# Patient Record
Sex: Female | Born: 1980 | Race: White | Hispanic: No | Marital: Married | Smoking: Never smoker
Health system: Southern US, Community
[De-identification: ages and names within clinical notes are randomized; demographics above are authoritative.]

## PROBLEM LIST (undated history)

## (undated) DIAGNOSIS — K219 Gastro-esophageal reflux disease without esophagitis: Secondary | ICD-10-CM

## (undated) DIAGNOSIS — E559 Vitamin D deficiency, unspecified: Secondary | ICD-10-CM

## (undated) DIAGNOSIS — R1310 Dysphagia, unspecified: Secondary | ICD-10-CM

## (undated) DIAGNOSIS — E538 Deficiency of other specified B group vitamins: Secondary | ICD-10-CM

## (undated) DIAGNOSIS — D649 Anemia, unspecified: Secondary | ICD-10-CM

## (undated) DIAGNOSIS — F32A Depression, unspecified: Secondary | ICD-10-CM

## (undated) DIAGNOSIS — E039 Hypothyroidism, unspecified: Secondary | ICD-10-CM

## (undated) DIAGNOSIS — M2550 Pain in unspecified joint: Secondary | ICD-10-CM

## (undated) DIAGNOSIS — F419 Anxiety disorder, unspecified: Secondary | ICD-10-CM

## (undated) DIAGNOSIS — R002 Palpitations: Secondary | ICD-10-CM

## (undated) DIAGNOSIS — I10 Essential (primary) hypertension: Secondary | ICD-10-CM

## (undated) HISTORY — DX: Dysphagia, unspecified: R13.10

## (undated) HISTORY — DX: Palpitations: R00.2

## (undated) HISTORY — PX: TONSILLECTOMY: SUR1361

## (undated) HISTORY — DX: Anxiety disorder, unspecified: F41.9

## (undated) HISTORY — DX: Depression, unspecified: F32.A

## (undated) HISTORY — PX: ANTERIOR CRUCIATE LIGAMENT REPAIR: SHX115

## (undated) HISTORY — DX: Vitamin D deficiency, unspecified: E55.9

## (undated) HISTORY — DX: Deficiency of other specified B group vitamins: E53.8

## (undated) HISTORY — DX: Anemia, unspecified: D64.9

## (undated) HISTORY — DX: Gastro-esophageal reflux disease without esophagitis: K21.9

## (undated) HISTORY — PX: NASAL SINUS SURGERY: SHX719

## (undated) HISTORY — DX: Pain in unspecified joint: M25.50

---

## 2001-12-17 ENCOUNTER — Encounter (INDEPENDENT_AMBULATORY_CARE_PROVIDER_SITE_OTHER): Payer: Self-pay

## 2001-12-17 ENCOUNTER — Ambulatory Visit (HOSPITAL_BASED_OUTPATIENT_CLINIC_OR_DEPARTMENT_OTHER): Admission: RE | Admit: 2001-12-17 | Discharge: 2001-12-17 | Payer: Self-pay | Admitting: Urology

## 2001-12-20 ENCOUNTER — Encounter: Payer: Self-pay | Admitting: Internal Medicine

## 2001-12-20 ENCOUNTER — Encounter: Admission: RE | Admit: 2001-12-20 | Discharge: 2001-12-20 | Payer: Self-pay | Admitting: Internal Medicine

## 2002-01-22 ENCOUNTER — Emergency Department (HOSPITAL_COMMUNITY): Admission: EM | Admit: 2002-01-22 | Discharge: 2002-01-22 | Payer: Self-pay | Admitting: Emergency Medicine

## 2002-01-25 ENCOUNTER — Inpatient Hospital Stay (HOSPITAL_COMMUNITY): Admission: RE | Admit: 2002-01-25 | Discharge: 2002-01-27 | Payer: Self-pay | Admitting: Internal Medicine

## 2002-01-25 ENCOUNTER — Encounter (INDEPENDENT_AMBULATORY_CARE_PROVIDER_SITE_OTHER): Payer: Self-pay | Admitting: Specialist

## 2002-01-26 ENCOUNTER — Encounter: Payer: Self-pay | Admitting: Internal Medicine

## 2002-01-31 HISTORY — PX: TUBAL LIGATION: SHX77

## 2002-02-08 ENCOUNTER — Other Ambulatory Visit: Admission: RE | Admit: 2002-02-08 | Discharge: 2002-02-08 | Payer: Self-pay | Admitting: Obstetrics and Gynecology

## 2002-04-09 ENCOUNTER — Encounter: Admission: RE | Admit: 2002-04-09 | Discharge: 2002-04-09 | Payer: Self-pay | Admitting: *Deleted

## 2002-04-09 ENCOUNTER — Encounter: Payer: Self-pay | Admitting: *Deleted

## 2002-05-16 ENCOUNTER — Emergency Department (HOSPITAL_COMMUNITY): Admission: EM | Admit: 2002-05-16 | Discharge: 2002-05-17 | Payer: Self-pay | Admitting: Emergency Medicine

## 2002-05-27 ENCOUNTER — Encounter: Payer: Self-pay | Admitting: Obstetrics and Gynecology

## 2002-05-27 ENCOUNTER — Encounter: Admission: RE | Admit: 2002-05-27 | Discharge: 2002-05-27 | Payer: Self-pay | Admitting: Obstetrics and Gynecology

## 2002-06-26 ENCOUNTER — Inpatient Hospital Stay (HOSPITAL_COMMUNITY): Admission: AD | Admit: 2002-06-26 | Discharge: 2002-06-26 | Payer: Self-pay | Admitting: Obstetrics and Gynecology

## 2002-06-27 ENCOUNTER — Inpatient Hospital Stay (HOSPITAL_COMMUNITY): Admission: AD | Admit: 2002-06-27 | Discharge: 2002-06-27 | Payer: Self-pay | Admitting: Obstetrics & Gynecology

## 2002-06-27 ENCOUNTER — Observation Stay (HOSPITAL_COMMUNITY): Admission: AD | Admit: 2002-06-27 | Discharge: 2002-06-28 | Payer: Self-pay | Admitting: Obstetrics & Gynecology

## 2002-07-03 ENCOUNTER — Inpatient Hospital Stay (HOSPITAL_COMMUNITY): Admission: AD | Admit: 2002-07-03 | Discharge: 2002-07-03 | Payer: Self-pay | Admitting: Obstetrics and Gynecology

## 2002-07-31 ENCOUNTER — Ambulatory Visit (HOSPITAL_COMMUNITY): Admission: RE | Admit: 2002-07-31 | Discharge: 2002-07-31 | Payer: Self-pay | Admitting: Obstetrics and Gynecology

## 2002-08-01 ENCOUNTER — Inpatient Hospital Stay (HOSPITAL_COMMUNITY): Admission: AD | Admit: 2002-08-01 | Discharge: 2002-08-04 | Payer: Self-pay | Admitting: Obstetrics and Gynecology

## 2002-08-01 ENCOUNTER — Encounter (INDEPENDENT_AMBULATORY_CARE_PROVIDER_SITE_OTHER): Payer: Self-pay

## 2002-08-11 ENCOUNTER — Inpatient Hospital Stay (HOSPITAL_COMMUNITY): Admission: AD | Admit: 2002-08-11 | Discharge: 2002-08-11 | Payer: Self-pay | Admitting: Obstetrics & Gynecology

## 2002-08-30 ENCOUNTER — Other Ambulatory Visit: Admission: RE | Admit: 2002-08-30 | Discharge: 2002-08-30 | Payer: Self-pay | Admitting: Obstetrics and Gynecology

## 2002-11-21 ENCOUNTER — Ambulatory Visit (HOSPITAL_COMMUNITY): Admission: RE | Admit: 2002-11-21 | Discharge: 2002-11-21 | Payer: Self-pay | Admitting: Obstetrics and Gynecology

## 2004-03-23 ENCOUNTER — Encounter: Admission: RE | Admit: 2004-03-23 | Discharge: 2004-03-23 | Payer: Self-pay | Admitting: Family Medicine

## 2005-06-29 ENCOUNTER — Encounter: Admission: RE | Admit: 2005-06-29 | Discharge: 2005-06-29 | Payer: Self-pay | Admitting: Family Medicine

## 2005-12-21 ENCOUNTER — Encounter: Admission: RE | Admit: 2005-12-21 | Discharge: 2005-12-21 | Payer: Self-pay | Admitting: Orthopedic Surgery

## 2006-12-22 ENCOUNTER — Ambulatory Visit (HOSPITAL_COMMUNITY): Admission: RE | Admit: 2006-12-22 | Discharge: 2006-12-22 | Payer: Self-pay | Admitting: Orthopedic Surgery

## 2006-12-22 ENCOUNTER — Encounter (INDEPENDENT_AMBULATORY_CARE_PROVIDER_SITE_OTHER): Payer: Self-pay | Admitting: Orthopedic Surgery

## 2006-12-22 ENCOUNTER — Ambulatory Visit: Payer: Self-pay | Admitting: Vascular Surgery

## 2007-08-15 ENCOUNTER — Encounter: Admission: RE | Admit: 2007-08-15 | Discharge: 2007-08-15 | Payer: Self-pay | Admitting: Family Medicine

## 2008-11-24 ENCOUNTER — Encounter: Admission: RE | Admit: 2008-11-24 | Discharge: 2008-11-24 | Payer: Self-pay | Admitting: Orthopedic Surgery

## 2010-02-21 ENCOUNTER — Encounter: Payer: Self-pay | Admitting: Family Medicine

## 2010-06-18 NOTE — H&P (Signed)
   Casey Berger, Casey Berger                      ACCOUNT NO.:  [id number]   MEDICAL RECORD NO.:  [id number]                   PATIENT TYPE:  OBV   LOCATION:  9151                                 FACILITY:  [facility]   PHYSICIAN:  Lenoard Aden, M.D.             DATE OF BIRTH:  1980/08/18   DATE OF ADMISSION:  06/27/2002  DATE OF DISCHARGE:                                HISTORY & PHYSICAL   CHIEF COMPLAINT:  Elevated blood pressure and spotting.   HISTORY OF PRESENT ILLNESS:  This patient is a 30 year old white female, G2,  P69, EDD of August 28, 2002, at 31+ weeks gestation who presents spotting one  day ago, and now with elevated blood pressures in the office status post  betamethasone on May 26 and May 27.   MEDICATIONS:  The patient's medications include Zoloft, Prevacid and  betamethasone as noted.   ALLERGIES:  The patient has no known drug allergies.   PAST HISTORY:  1. History of postpartum depression.  2. History of sinus surgery.  3. History of tonsillectomy.  4. Cystoscopy.  5. Sigmoidoscopy.  6. History of questionable colitis-related symptoms.   PRENATAL LABORATORY DATA:  Blood type O positive, Rh antibody negative.  Rubella immune.  Hepatitis B surface antigen and HIV were declined.   PHYSICAL EXAMINATION:  GENERAL:  On physical exam she is a well-developed,  well-nourished white female in no acute distress.  VITAL SIGNS:  Blood pressure 142/98, 150/96 and 150/90.  HEENT:  Normal.  LUNGS:  Clear.  HEART:  Regular rhythm.  ABDOMEN:  Soft, gravid and nontender.  PELVIC EXAMINATION:  Deferred.  EXTREMITIES:  No cords.  NEUROLOGIC EXAMINATION:  Nonfocal.   The NST is reactive.  __________  regular, usual activity noted.   IMPRESSION:  1. Thirty-one-week intrauterine pregnancy.  2. Known complete previa with a history of spotting.  3. History of herpes simplex virus.  4. History of vaginal delivery with perineal dehiscence.  5. Elevation of blood pressure.   PLAN:  1. Admit for 23-hour observation.  2. Possible admission.  3. Bleeding precautions; hold clots.  4. Check PIH labs, serial blood pressure monitoring.  5. Continuous fetal monitoring.  6. Follow up within 24 hours.                                                  Lenoard Aden, M.D.    RJT/MEDQ  D:  06/27/2002  T:  06/28/2002  Job:  [id number]

## 2010-06-18 NOTE — Op Note (Signed)
Casey Berger, Casey Berger                      ACCOUNT NO.:  [id number]   MEDICAL RECORD NO.:  [id number]                   PATIENT TYPE:  AMB   LOCATION:  SDC                                  [facility]ACILITY:  [facility]   PHYSICIAN:  Lenoard Aden, M.D.             DATE OF BIRTH:  April 01, 1980   DATE OF PROCEDURE:  11/21/2002  DATE OF DISCHARGE:                                 OPERATIVE REPORT   PREOPERATIVE DIAGNOSIS:  Elective sterilization.   POSTOPERATIVE DIAGNOSES:  1. Elective sterilization.  2. Abdominal wall-omental adhesions.   PROCEDURE:  Laparoscopic tubal sterilization and lysis of adhesions.   SURGEON:  Lenoard Aden, M.D.   ESTIMATED BLOOD LOSS:  Less than 50 mL.   ANESTHESIA:  General.   There were no complications.  The patient to recovery in good condition.   FINDINGS:  Normal tube, normal ovaries, normal anterior and posterior cul-de-  sac.  There are omental adhesions to the anterior abdominal wall.   DESCRIPTION OF PROCEDURE:  After being apprised of the risks of anesthesia,  infection, bleeding, injury to abdominal organs and need for repair, failure  risk of tubal ligation of five to 10 per thousand, the patient was brought  to the operating room, where she was administered a general anesthetic  without complications, prepped and draped in the usual sterile fashion,  catheterized until the bladder is empty.  Exam under anesthesia revealed a  midposition, anteflexed uterus, normal size, shape, and contour, no adnexal  masses.  A Hulka tenaculum placed per vagina, infraumbilical incision made  with a scalpel after placement of dilute Marcaine, Veress needle placed,  opening pressure of -1 noted, 3.5 L CO2 insufflated without difficulty.  Atraumatic placement of a Taut trocar is done without difficulty.  Visualization reveals atraumatic intra-abdominal, intraperitoneal entry.  Pictures taken, normal liver, gallbladder bed.  Anterior abdominal wall  adhesions  of the omentum are cauterized using Kleppinger bipolar cautery and  cut using hook scissors.  Good hemostasis is noted.  Attention is turned to  the uterus and tubes, whereby the right tube is traced out to the fimbriated  end, coagulated down to a resistance of 0 using the Kleppinger bipolar  cautery in three contiguous areas, and divided using hook scissors.  Same  procedure done on the left.  Normal ovaries and tubes are noted as  previously noted.  At this time CO2 is released, good hemostasis is noted.  All instruments removed from the abdomen.  A 0 Vicryl placed in the  infraumbilical incision.  Skin closed with Dermabond.  The patient tolerates  the procedure well, is awakened and transferred to recovery in good  condition.  Lenoard Aden, M.D.   RJT/MEDQ  D:  11/21/2002  T:  11/21/2002  Job:  [id number]

## 2010-06-18 NOTE — Discharge Summary (Signed)
Casey Berger, STONG NO.:  [id number]   MEDICAL RECORD NO.:  [id number]                   PATIENT TYPE:  INP   LOCATION:  0451                                 FACILITY:  [facility]   PHYSICIAN:  Barbette Hair. Arlyce Dice, M.D. [facility]          DATE OF BIRTH:  Jun 15, 1980   DATE OF ADMISSION:  01/25/2002  DATE OF DISCHARGE:  01/27/2002                                 DISCHARGE SUMMARY   ADMISSION DIAGNOSES:  58. A 30 year old white female with acute colitis, rule out ischemic versus     infectious.  2. Threatened nutritional status in the setting of early intrauterine     pregnancy.  3. A nine week pregnancy by size and dates.  4. New diagnosis of interstitial cystitis with urethral dilation, November     2003.  5. Status post tonsillectomy and adenoidectomy.  6. Prior sinus surgery.  7. Depression.   DISCHARGE DIAGNOSES:  1. Resolving acute colitis, suspected segmental ischemic colitis,     symptomatically markedly improved with biopsies pending at the time of     discharge.  2. Threatened nutritional status in the setting of early intrauterine     pregnancy.  3. A nine week pregnancy by size and dates.  4. New diagnosis of interstitial cystitis with urethral dilation, November     2003.  5. Status post tonsillectomy and adenoidectomy.  6. Prior sinus surgery.  7. Depression.   CONSULTATIONS:  None.   PROCEDURE:  Flexible sigmoidoscopy on the day of admission per Lina Sar,  M.D. with biopsies.   BRIEF HISTORY:  Ayshia is a pleasant 30 year old white female, generally  in good health with history as described above.  She has not had any prior  GI history.  She had onset on Monday, January 21, 2002, with fairly acute,  severe, crampy, lower abdominal pain followed by loose stools which was then  followed by dark red blood.  She said she was nauseated and vomited once on  January 21, 2002.  She has not had any documented fever or chills but has  been  having 2-3 bowel movements per day associated with red blood.  Initially, she was having rather constant lower abdominal pain which is now  more intermittent and cramping.  She had been unable to eat over the past 5-  6 days and had been primarily on liquids.  She had tried to eat some food  prior to the day of admission with abrupt increase in her abdominal pain and  diarrhea.  She was initially seen in the emergency room at [facility]  a few days ago and then sent to Lenoard Aden, M.D. in [place] for  OB/GYN evaluation, and she is nine weeks pregnant.  A pelvic ultrasound has  been done which shows she has a normal viable nine week pregnancy  apparently.  GI was then called, and the patient was sent to the emergency  room at  [facility] for further evaluation.  She was seen by Wilhemina Bonito. Marina Goodell,  M.D., who was covering on-call on January 23, 2002.  At that time, CBC was  normal with normal white count and a hemoglobin of 13.4.  UA was negative.  It was felt that she likely had an infectious colitis and was discharged  home with Cipro and Darvocet.  She was scheduled to see Lina Sar, M.D. in  the office in 48 hours.  Apparently since that time, she had been unable to  take the Cipro due to increased nausea, continued to have low-grade temp.  and watery stool, has not seen any blood in the past 24 hours.  She is still  not eating, and there was concern about her nutritional status.   The patient underwent flexible sigmoidoscopy with Lina Sar, M.D. on the  day of admission with finding of an acute colitis from 40-60 cm.  It was  felt this was consistent with an ischemic picture.  Biopsies were taken, and  she was admitted to the hospital for IV fluids, bowel rest, and Unasyn and  for possible TNA.   LABORATORY DATA, January 25, 2002:  WBC 5.1, hemoglobin 11.7, hematocrit  32.6, MCV 82.5, platelets 225, sed rate 42.  Electrolytes within normal  limits.  BUN 4, creatinine  0.7, albumin 3.  LFTs normal.  Follow-up on  January 27, 2002, shows WBC of 3.9, hemoglobin 11.7, hematocrit 33.7.  Sed  rate was 42.  TSH 1.476.  Prealbumin 14.3 on admission.  UA was negative.  Stool for C. difficile was negative.  Stool for O&P negative and stool  cultures were negative.   X-RAY STUDIES:  A PICC line was placed on January 26, 2002, in anticipation  of possible TNA.   HOSPITAL COURSE:  The patient was admitted to the service of Lina Sar,  M.D. with plans as outlined above.  She was started on IV fluids, covered  with IV Unasyn.  Stool cultures were obtained, and flexible sigmoidoscopy  was done just prior to admission.  Dr. Juanda Chance also scheduled PICC line  placement with anticipation of TNA.  By January 26, 2002, the patient had  significant decrease in diarrhea and abdominal discomfort and was feeling  better.  She was continued on a clear liquid diet, and TNA was held off.  By  January 27, 2002, she was feeling much improved, anxious to go home, and  had had no further pain or cramping, said her lower abdominal pain had  pretty much resolved, and she had not had any bowel movements in 24 hours.  She had been afebrile since admission.  Vital signs were stable.  WBC was  3.9, hemoglobin 11.6.  Stool cultures were negative, and the colon biopsies  were still pending.  She was felt to be stable and allowed discharge to home  after we had made sure she could tolerate a solid diet which she did.  We  discontinued the Unasyn.   MEDICATIONS ON DISCHARGE:  Prenatal vitamin daily and Zoloft 50 mg daily.   DIET:  Soft, bland.   FOLLOW UP:  She was to follow up with Dr. Juanda Chance in the office in two weeks  or sooner if necessary.   ADDENDUM:  Colon biopsies showed fragments of colonic mucosa with fibrosis,  mucus depleted glands and hemorrhage.  Appearance consistent with an ischemic process; however, given the patient's young age, resolving severe  acute colitis also  in the differential.  No features  of cytomegalovirus or  granuloma identified.     Mike Gip, P.A.-C. [facility]                Robert D. Arlyce Dice, M.D. [facility]    AE/MEDQ  D:  02/07/2002  T:  02/07/2002  Job:  [id number]   cc:   Lina Sar, M.D. [facility]   Lenoard Aden, M.D.  [address], [address]  [place]  [place] [address]  Fax: [phone]

## 2010-06-18 NOTE — H&P (Signed)
Casey Berger, Casey Berger                      ACCOUNT NO.:  [id number]   MEDICAL RECORD NO.:  [id number]                   PATIENT TYPE:  INP   LOCATION:  NA                                   [facility]ACILITY[facility]   PHYSICIAN:  Lenoard Aden, M.D.             DATE OF BIRTH:  1980/05/17   DATE OF ADMISSION:  DATE OF DISCHARGE:                                HISTORY & PHYSICAL   CHIEF COMPLAINT:  Complete placenta previa.   HISTORY OF PRESENT ILLNESS:  The patient is a 30 year old white female G2  P1, EDD of August 28, 2002 at 36 weeks with positive lung maturity per  amniocentesis and complete placenta previa who presents for primary C-  section.   PAST MEDICAL HISTORY:  1. Remarkable for questionable diagnosis of recent ischemic colitis.  2. History of cystoscopy for interstitial cystitis.  3. History of postpartum depression with previous pregnancy; currently on     Zoloft.  4. History of left ankle fracture.  5. 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  6. Sinus surgery, T&A, and sigmoidoscopy.  7. Fourth degree laceration noted with previous vaginal delivery and this     was a spontaneous vaginal birth.   MEDICATIONS:  Prenatal vitamins and Zoloft.   ALLERGIES:  No known drug allergies.   The pregnancy is complicated by placenta previa, now the ischemic colitis,  and intermittent depressive symptoms.  The patient has had no vaginal  bleeding.   PHYSICAL EXAMINATION:  GENERAL:  She is a well-developed, well-nourished  white female in no acute distress.  HEENT:  Normal.  LUNGS:  Clear.  HEART:  Regular rhythm.  ABDOMEN:  Soft, gravid, and nontender.  PELVIC:  Cervical exam is deferred.  EXTREMITIES:  Show no cords.  NEUROLOGIC:  Nonfocal.   IMPRESSION:  Complete placenta previa at 36 weeks status post positive  mature indices on amniocentesis.   PLAN:  Proceed with a primary C-section.  Risks of anesthesia, infection,  bleeding, injury to abdominal  organs with need for repair is discussed.  The  patient has 2 units of donated blood pending and ready for use.  Transfusion  risks discussed.  Risks of infection, HIV, and hepatitis previously  discussed with the patient.  Under these circumstances,  transfusions need to be pursued in order to preserve her well-being.  The  possible need for prolonged invasive surgery to include the possible need  for hysterectomy discussed with the patient in detail today.  The patient  and husband in attendance.  They acknowledge to proceed.  Will continue her  Zoloft postpartum.                                               Lenoard Aden, M.D.    RJT/MEDQ  D:  08/01/2002  T:  08/01/2002  Job:  [id number]

## 2010-06-18 NOTE — H&P (Signed)
NAMEKARISTA, AISPURO NO.:  [id number]   MEDICAL RECORD NO.:  [id number]                   PATIENT TYPE:  INP   LOCATION:  0451                                 FACILITY[facility]   PHYSICIAN:  Lina Sar, M.D. [facility]               DATE OF BIRTH:  October 03, 1980   DATE OF ADMISSION:  01/25/2002  DATE OF DISCHARGE:                                HISTORY & PHYSICAL   PROBLEM:  Nine weeks' pregnant with acute illness with left lower quadrant  abdominal pain and rectal bleeding.   HISTORY OF PRESENT ILLNESS:  The patient is a pleasant 30 year old white  female in general good health.  She has had a recent diagnosis of  interstitial cystitis and developed, apparently, a urethral stricture after  her first pregnancy.  The patient has not had any prior GI illnesses or  problems.  At this time she said she had acute onset on Monday, January 21, 2002, with fairly severe crampy lower abdominal pain followed by loose stool  which was then followed by dark red blood.  She said she was nauseated with  this and vomited once on Monday.  She has not had any documented fever or  chills but ever since then has been having two to three bowel movements per  day associated with red blood.  Initially she had constant lower abdominal  pain which is now more intermittent and crampy in nature.  She has not been  eating over the past five to six days, primarily on liquids.  She says that  she tried to eat some solid food yesterday on Christmas Day and had abrupt  increase in her lower abdominal pain and then diarrhea.   The patient was initially seen in the emergency room at [facility]  four days ago and then sent to Dr. Billy Coast for OB/GYN evaluation as she is  nine weeks' pregnant.  She had a pelvic ultrasound which was apparently  normal, showing a viable nine-week pregnancy.  She has not had any vaginal  bleeding.  She was then sent to the emergency room at [facility] on  January 23, 2002, and was evaluated by Dr. Marina Goodell for GI.  WBC at that time  was 10.5, hemoglobin was 13.4, hematocrit was 38.7.  UA was negative.  She  was felt to have an infectious colitis and was discharged home with Cipro  and Darvocet.  She was scheduled to see Dr. Juanda Chance in follow-up in the  office today.  She says she was unable to take the Cipro because it made her  more nauseated and only took two tablets.  She had a low-grade temperature  yesterday at home and has continued with watery stool one time yesterday and  one time today.  She has not seen any blood since yesterday morning.  She is  not eating and is very concerned about this due to early pregnancy.  Dr. Juanda Chance has since done flexible sigmoidoscopy today with finding of an  acute colitis from 40 cm up to 60 cm.  Above that was not visualized.  It  was felt that this was most consistent with an ischemic picture, and  biopsies were taken.  At this time the patient is admitted to the hospital  for IV fluids, bowel rest, and will be covered with IV Unasyn awaiting  cultures and biopsies.   CURRENT MEDICATIONS:  1. Cipro was prescribed, two doses taken.  2. Darvocet-N 100 prescribed January 23, 2002, two to three doses taken.  3. Tylenol p.r.n.  4. Zoloft 50 mg h.s.  5. Multivitamin daily.   ALLERGIES:  No known drug allergies.   PAST MEDICAL HISTORY:  1. Urethral dilation in November 2003, with urethral stricture after     episiotomy in January 2003, with birth.  2. Probable interstitial cystitis by patient's report.  3. Depression.  4. Prior sinus surgery.  5. Normal vaginal delivery January 2003.  6. Status post T&A.   FAMILY HISTORY:  Negative for GI disease.  Mother with GERD.  Otherwise  unremarkable.   SOCIAL HISTORY:  The patient is married.  She is currently a homemaker.  Has  an 62-month-old son at home.  Currently nine weeks pregnant.  No tobacco,  and no ETOH.   REVIEW OF SYSTEMS:   CARDIOVASCULAR:  Negative.  PULMONARY:  Negative.  GENITOURINARY:  Negative currently.  GASTROINTESTINAL:  As outlined above.   PHYSICAL EXAMINATION:  GENERAL:  Well-developed white female in no acute  distress.  She is anxious.  VITAL SIGNS:  Blood pressure 150/94, pulse 80, temperature 99.  Weight 230.  HEENT:  Normocephalic, atraumatic.  EOMI.  PERRLA.  Sclerae anicteric.  NECK:  Supple.  Without nodes.  CARDIOVASCULAR:  Regular rate and rhythm.  With S1 and S2.  No murmur, rub,  or gallop.  LUNGS:  Clear to A&P.  ABDOMEN:  Soft, obese.  Bowel sounds are present but hypoactive.  She is  tender in the left lower quadrant.  No guarding or rebound.  No mass or  hepatosplenomegaly.  Fetal heart sounds not heard with regular stethoscope.  RECTAL:  Per Dr. Juanda Chance, yellow heme-positive stool.  EXTREMITIES:  Without clubbing, cyanosis, or edema.  NEUROLOGIC:  Grossly nonfocal.   IMPRESSION:  1. The patient is a 30 year old female with acute colitis, likely ischemic.  2. Threatened nutritional status in the setting of early intrauterine     pregnancy.  3. Intrauterine pregnancy, nine weeks by size and dates.  4. New diagnosis of interstitial cystitis with urethral dilation November     2003.  5. Status post tonsillectomy and adenoidectomy.  6. Prior sinus surgery.  7. Depression.   PLAN:  The patient is admitted to the service of Dr. Lina Sar for IV  fluid hydration.  Will check stool for C&S, O&P and C. difficile.  Await  biopsies.  PICC line to be placed for TNA.  She will be covered empirically  with IV Unasyn in the short-term.  For details, please see the orders.    ADDENDUM:  The patient did relate that her husband had been acutely ill for  48 hours.  His illness started the day prior to hers.  He had nausea,  vomiting, and diarrhea which rapidly abated.     Mike Gip, P.A.-C. [facility]                Lina Sar, M.D. [facility]   AE/MEDQ  D:  01/25/2002  T:  01/25/2002  Job:   [id number]   cc:   Lenoard Aden, M.D.  [address], [address]  [place]  [place] [address]  Fax: [phone]

## 2010-06-18 NOTE — Op Note (Signed)
NAMEJESSICAANN, OVERBAUGH                        ACCOUNT NO.:  [id number]   MEDICAL RECORD NO.:  [id number]                   PATIENT TYPE:  AMB   LOCATION:  NESC                                 FACILITY:  [facility]   PHYSICIAN:  Maretta Bees. Vonita Moss, M.D.             DATE OF BIRTH:  1980-08-19   DATE OF PROCEDURE:  12/17/2001  DATE OF DISCHARGE:                                 OPERATIVE REPORT   PREOPERATIVE DIAGNOSIS:  Rule out urethral stricture as the cause of  frequency and urgency of urination.   POSTOPERATIVE DIAGNOSIS:  Rule out interstitial cystitis.   PROCEDURE:  Cystoscopy, urethral dilation, hydraulic dilation of bladder and  cold cup bladder biopsy.   SURGEON:  Maretta Bees. Vonita Moss, M.D.   ANESTHESIA:  General.   INDICATIONS:  This 30 year old white female delivered her first baby several  months ago and had a vaginal tear and a postoperative infection. Since then  she has had increased urgency, frequency, fullness in the bladder, frequency  x 12 in the day and nocturia x 2 to 3. Antibiotics have not helped her. She  had a negative urine and a small residual urine in early October. She was  put on trimethoprim  for urethritis and also some Urised, but her frequency  and slow dribbly strain and nocturia persisted. She is brought to the  operating room today for further evaluation.   DESCRIPTION OF PROCEDURE:  The patient was brought to the operating room and  placed in the lithotomy position. The external genitalia were prepped and  draped in the usual fashion. The urethral meatus was in the normal position.  There was no evidence of a urethral tear or foreshortening. Female sounds  were passed to [address] [place] with no significant tightness.   She was then  cystoscoped. The bladder had no stones, tumors, inflammatory  lesions. There were no intraurethral lesions. I then filled her to 700 cc  and looked back in because she was uncomfortable with filling, and she had  submucosal  petechiae and hemorrhage on the bladder floor and also some  smaller petechiae in the other three quadrants of the bladder, suspicious  for interstitial cystitis.   Cold cup bladder biopsy was used to perform biopsies of the hemorrhagic  areas on the bladder floor, which were then fulgurated with the Bugbee  electrode. The bladder was emptied and the scope was removed and the patient  was  sent to the  recovery room in good condition after inserting a B&O  suppository.                                                Maretta Bees. Vonita Moss, M.D.    LJP/MEDQ  D:  12/17/2001  T:  12/17/2001  Job:  [id number]   cc:  Francis P. Modesto Charon, M.D.

## 2010-06-18 NOTE — Discharge Summary (Signed)
   NAMEHALA, NARULA                      ACCOUNT NO.:  [id number]   MEDICAL RECORD NO.:  [id number]                   PATIENT TYPE:  INP   LOCATION:  9139                                 [facility]ACILITY[facility]   PHYSICIAN:  Lenoard Aden, M.D.             DATE OF BIRTH:  [birth year]   DATE OF ADMISSION:  08/01/2002  DATE OF DISCHARGE:  08/04/2002                                 DISCHARGE SUMMARY   DISCHARGE SUMMARY:  The patient underwent an uncomplicated primary cesarean  section for placenta previa on 08/01/2002 and discharged home on 08/03/2002.  The postoperative course was uncomplicated.  Tolerated a regular diet well  and discharged to home on Zoloft, Percocet, Motrin, Chromagen.  Discharge  teaching done.  Follow up in the office in 4-6 weeks.                                                Lenoard Aden, M.D.    RJT/MEDQ  D:  08/25/2002  T:  08/25/2002  Job:  [id number]

## 2010-06-18 NOTE — Op Note (Signed)
[name], Casey Berger                      ACCOUNT NO.:  [id number]   MEDICAL RECORD NO.:  [id number]                   PATIENT TYPE:  INP   LOCATION:  9139                                 FACILITY:  [facility]   PHYSICIAN:  Lenoard Aden, M.D.             DATE OF BIRTH:  1980-09-16   DATE OF PROCEDURE:  08/01/2002  DATE OF DISCHARGE:                                 OPERATIVE REPORT   PREOPERATIVE DIAGNOSES:  Complete placenta previa 36 weeks, mature lung  indices by amniocentesis.   POSTOPERATIVE DIAGNOSES:  Complete placenta previa 36 weeks, mature lung  indices by amniocentesis.   PROCEDURE:  Primary low segment transverse cesarean section.   SURGEON:  Lenoard Aden, M.D.   ASSISTANT:  __________   Bronwen Betters BLOOD LOSS:  1000 mL.   COMPLICATIONS:  None.   DRAINS:  Foley catheter.   COUNTS:  Correct.   DISPOSITION:  The patient was taken to recovery room in good condition.   DESCRIPTION OF PROCEDURE:  After being apprised of the risks and benefits of  anesthesia, infection, bleeding, injury to abdominal organs with need for  repair, transfusion risks, anesthesia risks, hysterectomy risks as  previously noted.  The patient is taken to the operating room where she was  administered a spinal anesthetic without complications and prepped and  draped in usual sterile fashion.  Foley catheter placed.  After achieving  adequate anesthesia, dilute Marcaine solution placed.  Pfannenstiel skin  incision made with a scalpel, carried down to the fascia which was nicked in  midline and opened up transversely using Mayo scissors.  Rectus muscles  dissected sharply. Parietal peritoneum opened sharply.  Visceral peritoneum  then scored in a smile-like fashion over the lower uterine segment.  There  are some large engorged venous vessels but no evidence of accreta or  increta. Bladder flap was easily dissected off the lower uterine segment.  Lower transverse incision is made.   Amniotomy of clear fluid  transplacentally done for an atraumatic delivery of a full term living female,  Apgars 7, 7 and 9.  Placenta delivered manually intact from an anterior  previa location and sent to pathology for confirmation.  At this time,  uterus is exteriorized, curetted using a dry lap pack and found to be  hemostatic and no evidence of accreta or increta are noted at this time.  Uterine incision closed in two layers using a 0 Monocryl, interrupted  sutures placed along both lateral angles to control oozing from the lateral  edges.  Bladder flap was inspected and found to be hemostatic.  Uterus is  placed in the abdominal cavity.  Pelvic gutters irrigated. All blood clots  removed.  Good hemostasis is confirmed.  Closure of the fascia was with 0  Monocryl in continuous running fashion.  Skin closed using staples.  The  patient tolerated the procedure well and was transferred to the recovery  room in good condition.  Lenoard Aden, M.D.    RJT/MEDQ  D:  08/01/2002  T:  08/02/2002  Job:  [id number]   cc:   [facility]

## 2010-06-18 NOTE — H&P (Signed)
   Casey Berger, Casey Berger                      ACCOUNT NO.:  [id number]   MEDICAL RECORD NO.:  [id number]                   PATIENT TYPE:  AMB   LOCATION:  SDC                                  FACILITY:  [facility]   PHYSICIAN:  Lenoard Aden, M.D.             DATE OF BIRTH:  [birth year]   DATE OF ADMISSION:  DATE OF DISCHARGE:                                HISTORY & PHYSICAL   CHIEF COMPLAINT:  Elective sterilization.   HISTORY OF PRESENT ILLNESS:  The patient is a 30 year old white female, G2  P2, status post C-section for placenta previa in July 2004 who presents for  elective sterilization.   PAST MEDICAL HISTORY:  Past medical history is remarkable for:  1. History of ischemic colitis.  2. History of cystoscopy for interstitial cystitis.  3. History of  postpartum depression.  4. History of left ankle fracture.  5. 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  6. Sinus surgery.  7. Tonsillectomy.  8. Sigmoidoscopy.  9. History of a fourth degree laceration with previous vaginal birth.   MEDICATIONS:  Prenatal vitamins and Zoloft.   ALLERGIES:  The patient has no known drug allergies.   FAMILY HISTORY:  Family history is noncontributory.   PHYSICAL EXAMINATION:  GENERAL APPEARANCE:  On physical examination she is a  well-developed, well-nourished white female in no acute distress.  HEENT:  Normal.  LUNGS:  Clear.  HEART:  Regular rhythm.  ABDOMEN:  Soft, non-gravid and nontender.  PELVIC EXAMINATION:  No adnexal masses.  EXTREMITIES:  Extremities show no cords.  NEUROLOGIC:  Neurologic exam is nonfocal.   IMPRESSION:  Desires for elective sterilization.   PLAN:  The plan is to proceed with laparoscopic tubal sterilization.   The risks of anesthesia, infection, bleeding and injury to abdominal organs  and need for repair were discussed, delayed versus immediate complications  to include bowel and bladder injury; the patient acknowledges and wishes to  proceed.                                                 Lenoard Aden, M.D.    RJT/MEDQ  D:  11/20/2002  T:  11/21/2002  Job:  [id number]

## 2010-06-18 NOTE — Op Note (Signed)
Casey Berger, METZINGER NO.:  [id number]   MEDICAL RECORD NO.:  [id number]                   PATIENT TYPE:  AMB   LOCATION:  ENDO                                 FACILITY:  [facility]   PHYSICIAN:  Lina Sar, M.D. [facility]               DATE OF BIRTH:  10-Jul-1980   DATE OF PROCEDURE:  01/25/2002  DATE OF DISCHARGE:                                 OPERATIVE REPORT   PROCEDURE:  Flexible sigmoidoscopy.   INDICATIONS FOR PROCEDURE:  This 30 year old white female presented with a  five day history of rectal bleeding, severe crampy abdominal pain, and  leukocytosis.  She was initially in the emergency room 48 hours ago, and  sent home on Cipro, but her condition has not changed except for a decrease  in the rectal bleeding.  She has had low-grade temperature and pain, she has  not been able to eat except for clear liquids.  She is pregnant and in line  by size and date.  She is undergoing flexible sigmoidoscopy because of  suspicion for ischemic colitis.   ENDOSCOPIC ASSESSMENT:  Olympus single chamber scope.   SEDATION:  1. Versed 6 mg IV.  2. Demerol 75 mg IV.   DESCRIPTION OF PROCEDURE:  The Olympus single chamber scope was passed in  the anal sphincter, rectum, to the sigmoid colon.  The patient was monitored  by pulse oximetry.  Oxygen saturations were normal.  The patient did not  receive any colon prep.  The anal canal and __________ was unremarkable.  Colonic mucosa was normal up until 35 to 40 cm from the rectum.  At 40 cm,  the mucosa became ulcerated with large confluent ulcerations covered with  exudate.  Changes were consistent with ischemic colitis.  Colonoscope passed  only up to 60 cm because of extreme discomfort.  Multiple biopsies were  taken from the colonic mucosa.  The patient tolerated the procedure well.   IMPRESSION:  Active colitis of the right colon consistent with ischemic  colitis, status post multiple biopsies.   PLAN:  The  patient will be admitted for bowel rest, intravenous support, and  nutrition supplemented as well.  We will insert a PICC line and start  central TNA to support her pregnancy nutritionally.  She will be on Unasyn  IV.  We will obtain stool cultures, O&P, and C. diff toxin.  Await results  of the biopsies.                                                Lina Sar, M.D. [facility]   DB/MEDQ  D:  01/25/2002  T:  01/25/2002  Job:  [id number]   cc:   Lenoard Aden, M.D.  [address], [address]  [place]  [place] [address]  Fax: [phone]

## 2011-07-12 DIAGNOSIS — F419 Anxiety disorder, unspecified: Secondary | ICD-10-CM | POA: Insufficient documentation

## 2011-07-12 DIAGNOSIS — F32A Depression, unspecified: Secondary | ICD-10-CM | POA: Insufficient documentation

## 2011-10-25 DIAGNOSIS — I10 Essential (primary) hypertension: Secondary | ICD-10-CM | POA: Insufficient documentation

## 2014-05-30 ENCOUNTER — Emergency Department (HOSPITAL_BASED_OUTPATIENT_CLINIC_OR_DEPARTMENT_OTHER)
Admission: EM | Admit: 2014-05-30 | Discharge: 2014-05-30 | Disposition: A | Discharge status: Home / Self Care | Payer: BLUE CROSS/BLUE SHIELD | Attending: Emergency Medicine | Admitting: Emergency Medicine

## 2014-05-30 ENCOUNTER — Encounter (HOSPITAL_BASED_OUTPATIENT_CLINIC_OR_DEPARTMENT_OTHER): Payer: Self-pay | Admitting: Emergency Medicine

## 2014-05-30 ENCOUNTER — Emergency Department (HOSPITAL_BASED_OUTPATIENT_CLINIC_OR_DEPARTMENT_OTHER): Payer: BLUE CROSS/BLUE SHIELD

## 2014-05-30 DIAGNOSIS — Z9889 Other specified postprocedural states: Secondary | ICD-10-CM | POA: Insufficient documentation

## 2014-05-30 DIAGNOSIS — I10 Essential (primary) hypertension: Secondary | ICD-10-CM | POA: Insufficient documentation

## 2014-05-30 DIAGNOSIS — Z79899 Other long term (current) drug therapy: Secondary | ICD-10-CM | POA: Diagnosis not present

## 2014-05-30 DIAGNOSIS — M25562 Pain in left knee: Secondary | ICD-10-CM

## 2014-05-30 HISTORY — DX: Essential (primary) hypertension: I10

## 2014-05-30 LAB — D-DIMER, QUANTITATIVE (NOT AT ARMC)

## 2014-05-30 MED ORDER — KETOROLAC TROMETHAMINE 60 MG/2ML IM SOLN
60.0000 mg | Freq: Once | INTRAMUSCULAR | Status: AC
  Administered 2014-05-30: 60 mg via INTRAMUSCULAR
  Filled 2014-05-30: qty 2

## 2014-05-30 MED ORDER — METHOCARBAMOL 500 MG PO TABS: 500.0000 mg | ORAL_TABLET | Freq: Two times a day (BID) | ORAL | Status: DC

## 2014-05-30 MED ORDER — MELOXICAM 15 MG PO TABS: 15.0000 mg | ORAL_TABLET | Freq: Every day | ORAL | Status: DC

## 2014-05-30 NOTE — ED Provider Notes (Signed)
CSN: [id number]     Arrival date & time 05/30/14  0122 History   First MD Initiated Contact with Patient 05/30/14 [phone]     Chief Complaint  Patient presents with  . Knee Pain     (Consider location/radiation/quality/duration/timing/severity/associated sxs/prior Treatment) Patient is a 34 y.o. female presenting with knee pain. The history is provided by the patient.  Knee Pain Location:  Knee Time since incident:  3 hours Injury: no   Knee location:  L knee Pain details:    Quality:  Burning   Radiates to:  Does not radiate   Severity:  Severe   Onset quality:  Sudden   Timing:  Constant   Progression:  Unchanged Chronicity:  New Dislocation: no   Foreign body present:  No foreign bodies Prior injury to area:  No Relieved by:  Nothing Worsened by:  Nothing tried Ineffective treatments:  None tried Associated symptoms: no back pain, no decreased ROM, no fatigue, no fever, no muscle weakness, no neck pain, no numbness, no swelling and no tingling   Risk factors: no frequent fractures     Past Medical History  Diagnosis Date  . Hypertension    Past Surgical History  Procedure Laterality Date  . Anterior cruciate ligament repair     History reviewed. No pertinent family history. History  Substance Use Topics  . Smoking status: Never Smoker   . Smokeless tobacco: Not on file  . Alcohol Use: No   OB History    No data available     Review of Systems  Constitutional: Negative for fever and fatigue.  Musculoskeletal: Negative for back pain and neck pain.  All other systems reviewed and are negative.     Allergies  Review of patient's allergies indicates no known allergies.  Home Medications   Prior to Admission medications   Medication Sig Start Date End Date Taking? Authorizing Provider  escitalopram (LEXAPRO) 20 MG tablet Take 20 mg by mouth daily.   Yes Historical Provider, MD  lisinopril-hydrochlorothiazide (PRINZIDE,ZESTORETIC) 20-25 MG per tablet Take  1 tablet by mouth daily.   Yes Historical Provider, MD  meloxicam (MOBIC) 15 MG tablet Take 1 tablet (15 mg total) by mouth daily. 05/30/14   Halaina Vanduzer, MD  methocarbamol (ROBAXIN) 500 MG tablet Take 1 tablet (500 mg total) by mouth 2 (two) times daily. 05/30/14   Donella Pascarella, MD   BP 113/62 mmHg  Pulse 63  Temp(Src) 98.6 F (37 C) (Oral)  Resp 18  Ht 5\' 9"  (1.753 m)  Wt 250 lb (113.399 kg)  BMI 36.90 kg/m2  SpO2 100%  LMP 05/09/2014 Physical Exam  Constitutional: She is oriented to person, place, and time. She appears well-developed and well-nourished. No distress.  HENT:  Head: Normocephalic and atraumatic.  Mouth/Throat: Oropharynx is clear and moist.  Eyes: Conjunctivae are normal. Pupils are equal, round, and reactive to light.  Neck: Normal range of motion. Neck supple.  Cardiovascular: Normal rate, regular rhythm and intact distal pulses.   Pulmonary/Chest: Effort normal and breath sounds normal. No respiratory distress. She has no wheezes. She has no rales.  Abdominal: Soft. Bowel sounds are normal. There is no tenderness. There is no rebound and no guarding.  Musculoskeletal: Normal range of motion. She exhibits no edema.       Left hip: Normal.       Left knee: She exhibits normal range of motion, no swelling, no effusion, no ecchymosis, no deformity, no laceration, no erythema, normal alignment, no LCL laxity,  normal patellar mobility, no bony tenderness and normal meniscus. No tenderness found. No medial joint line, no lateral joint line, no MCL, no LCL and no patellar tendon tenderness noted.       Left ankle: Normal. Achilles tendon normal.       Left foot: Normal.  Neurological: She is alert and oriented to person, place, and time. She has normal reflexes.  Skin: Skin is warm and dry.  Psychiatric: She has a normal mood and affect.    ED Course  Procedures (including critical care time) Labs Review Labs Reviewed  D-DIMER, QUANTITATIVE    Imaging Review Dg  Knee Complete 4 Views Left  05/30/2014   CLINICAL DATA:  Severe left knee pain, with burning and swelling. Initial encounter.  EXAM: LEFT KNEE - COMPLETE 4+ VIEW  COMPARISON:  None.  FINDINGS: There is no evidence of fracture or dislocation. The joint spaces are preserved. No significant degenerative change is seen; the patellofemoral joint is grossly unremarkable in appearance. A fabella is noted.  Trace knee joint fluid is within normal limits. The visualized soft tissues are normal in appearance.  IMPRESSION: No evidence of fracture or dislocation.   Electronically Signed   By: Roanna Raider M.D.   On: 05/30/2014 02:26     EKG Interpretation None      MDM   Final diagnoses:  Knee pain, acute, left   Xray and Ddimer are both normal.  No trauma no redness nor warmth nor swelling.   Ice elevation and NSAIDs and prn follow up with orthopedic surgery    Kayceon Oki, MD 05/30/14 1610

## 2014-05-30 NOTE — ED Notes (Signed)
Patient transported to X-ray 

## 2014-05-30 NOTE — ED Notes (Signed)
Pt states that she developed severe pain in her left knee all of the sudden today, reports knee is swollen pmh : dvt to right knee after acl repair

## 2014-05-30 NOTE — Discharge Instructions (Signed)
Cryotherapy Cryotherapy is when you put ice on your injury. Ice helps lessen pain and puffiness (swelling) after an injury. Ice works the best when you start using it in the first 24 to 48 hours after an injury. HOME CARE  Put a dry or damp towel between the ice pack and your skin.  You may press gently on the ice pack.  Leave the ice on for no more than 10 to 20 minutes at a time.  Check your skin after 5 minutes to make sure your skin is okay.  Rest at least 20 minutes between ice pack uses.  Stop using ice when your skin loses feeling (numbness).  Do not use ice on someone who cannot tell you when it hurts. This includes small children and people with memory problems (dementia). GET HELP RIGHT AWAY IF:  You have white spots on your skin.  Your skin turns blue or pale.  Your skin feels waxy or hard.  Your puffiness gets worse. MAKE SURE YOU:   Understand these instructions.  Will watch your condition.  Will get help right away if you are not doing well or get worse. Document Released: 07/06/2007 Document Revised: 04/11/2011 Document Reviewed: 09/09/2010 ExitCare Patient Information 2015 [employer]. This information is not intended to replace advice given to you by your health care provider. Make sure you discuss any questions you have with your health care provider.  

## 2014-07-14 ENCOUNTER — Telehealth: Payer: Self-pay

## 2014-07-14 ENCOUNTER — Encounter (HOSPITAL_COMMUNITY): Payer: Self-pay | Admitting: Emergency Medicine

## 2014-07-14 ENCOUNTER — Ambulatory Visit (HOSPITAL_COMMUNITY)
Admission: EM | Admit: 2014-07-14 | Discharge: 2014-07-14 | Disposition: A | Discharge status: Home / Self Care | Payer: BLUE CROSS/BLUE SHIELD | Attending: Emergency Medicine | Admitting: Emergency Medicine

## 2014-07-14 ENCOUNTER — Emergency Department (HOSPITAL_COMMUNITY): Payer: BLUE CROSS/BLUE SHIELD

## 2014-07-14 ENCOUNTER — Encounter (HOSPITAL_COMMUNITY)
Admission: EM | Disposition: A | Discharge status: Home / Self Care | Payer: Self-pay | Source: Home / Self Care | Attending: Emergency Medicine

## 2014-07-14 DIAGNOSIS — Y999 Unspecified external cause status: Secondary | ICD-10-CM | POA: Diagnosis not present

## 2014-07-14 DIAGNOSIS — K209 Esophagitis, unspecified: Secondary | ICD-10-CM | POA: Insufficient documentation

## 2014-07-14 DIAGNOSIS — K219 Gastro-esophageal reflux disease without esophagitis: Secondary | ICD-10-CM | POA: Insufficient documentation

## 2014-07-14 DIAGNOSIS — T18128A Food in esophagus causing other injury, initial encounter: Secondary | ICD-10-CM

## 2014-07-14 DIAGNOSIS — Y929 Unspecified place or not applicable: Secondary | ICD-10-CM | POA: Diagnosis not present

## 2014-07-14 DIAGNOSIS — E039 Hypothyroidism, unspecified: Secondary | ICD-10-CM | POA: Diagnosis not present

## 2014-07-14 DIAGNOSIS — R1310 Dysphagia, unspecified: Secondary | ICD-10-CM | POA: Diagnosis present

## 2014-07-14 DIAGNOSIS — I10 Essential (primary) hypertension: Secondary | ICD-10-CM | POA: Insufficient documentation

## 2014-07-14 DIAGNOSIS — IMO0001 Reserved for inherently not codable concepts without codable children: Secondary | ICD-10-CM

## 2014-07-14 DIAGNOSIS — Y939 Activity, unspecified: Secondary | ICD-10-CM | POA: Insufficient documentation

## 2014-07-14 DIAGNOSIS — X58XXXA Exposure to other specified factors, initial encounter: Secondary | ICD-10-CM | POA: Diagnosis not present

## 2014-07-14 DIAGNOSIS — K222 Esophageal obstruction: Secondary | ICD-10-CM | POA: Diagnosis not present

## 2014-07-14 DIAGNOSIS — T189XXA Foreign body of alimentary tract, part unspecified, initial encounter: Secondary | ICD-10-CM

## 2014-07-14 DIAGNOSIS — K449 Diaphragmatic hernia without obstruction or gangrene: Secondary | ICD-10-CM | POA: Diagnosis not present

## 2014-07-14 DIAGNOSIS — R1110 Vomiting, unspecified: Secondary | ICD-10-CM

## 2014-07-14 HISTORY — PX: ESOPHAGOGASTRODUODENOSCOPY: SHX5428

## 2014-07-14 HISTORY — DX: Hypothyroidism, unspecified: E03.9

## 2014-07-14 SURGERY — EGD (ESOPHAGOGASTRODUODENOSCOPY)
Anesthesia: Moderate Sedation

## 2014-07-14 MED ORDER — BUTAMBEN-TETRACAINE-BENZOCAINE 2-2-14 % EX AERO
INHALATION_SPRAY | CUTANEOUS | Status: DC | PRN
  Administered 2014-07-14: 2 via TOPICAL

## 2014-07-14 MED ORDER — CALCIUM GLUCONATE 10 % IV SOLN
1.0000 g | Freq: Once | INTRAVENOUS | Status: AC
  Administered 2014-07-14: 1 g via INTRAVENOUS
  Filled 2014-07-14: qty 10

## 2014-07-14 MED ORDER — GI COCKTAIL ~~LOC~~
30.0000 mL | Freq: Once | ORAL | Status: AC
  Administered 2014-07-14: 30 mL via ORAL
  Filled 2014-07-14: qty 30

## 2014-07-14 MED ORDER — MIDAZOLAM HCL 5 MG/ML IJ SOLN
INTRAMUSCULAR | Status: AC
  Filled 2014-07-14: qty 2

## 2014-07-14 MED ORDER — SODIUM CHLORIDE 0.9 % IV SOLN
INTRAVENOUS | Status: DC
  Administered 2014-07-14: 1000 mL via INTRAVENOUS

## 2014-07-14 MED ORDER — NITROGLYCERIN 0.4 MG SL SUBL
0.4000 mg | SUBLINGUAL_TABLET | Freq: Once | SUBLINGUAL | Status: AC
  Administered 2014-07-14: 0.4 mg via SUBLINGUAL
  Filled 2014-07-14: qty 1

## 2014-07-14 MED ORDER — FENTANYL CITRATE (PF) 100 MCG/2ML IJ SOLN
INTRAMUSCULAR | Status: AC
  Filled 2014-07-14: qty 2

## 2014-07-14 MED ORDER — OMEPRAZOLE 20 MG PO CPDR: 20.0000 mg | DELAYED_RELEASE_CAPSULE | Freq: Two times a day (BID) | ORAL | Status: DC

## 2014-07-14 MED ORDER — FENTANYL CITRATE (PF) 100 MCG/2ML IJ SOLN
INTRAMUSCULAR | Status: DC | PRN
  Administered 2014-07-14 (×4): 25 ug via INTRAVENOUS

## 2014-07-14 MED ORDER — MIDAZOLAM HCL 10 MG/2ML IJ SOLN
INTRAMUSCULAR | Status: DC | PRN
  Administered 2014-07-14 (×4): 2 mg via INTRAVENOUS

## 2014-07-14 MED ORDER — LORAZEPAM 2 MG/ML IJ SOLN
1.0000 mg | Freq: Once | INTRAMUSCULAR | Status: AC
  Administered 2014-07-14: 1 mg via INTRAVENOUS
  Filled 2014-07-14: qty 1

## 2014-07-14 NOTE — H&P (Signed)
  HPI: This is a very pleasant woman who ate steak last night. Felt food bolus stick in low esopahgus.  Has not gotten relief of that sensation.  Regurgitates liquids at home and in ER.  She does not have chronic GERD symptoms. Has had very rare, minor dysphagia. No weight loss.   Past Medical History  Diagnosis Date  . Hypertension   . Hypothyroidism     Past Surgical History  Procedure Laterality Date  . Anterior cruciate ligament repair    . Tonsillectomy    . Cesarean section      No current facility-administered medications for this encounter.    Allergies as of 07/14/2014  . (No Known Allergies)    History reviewed. No pertinent family history.  History   Social History  . Marital Status: Married    Spouse Name: N/A  . Number of Children: N/A  . Years of Education: N/A   Occupational History  . Not on file.   Social History Main Topics  . Smoking status: Never Smoker   . Smokeless tobacco: Not on file  . Alcohol Use: No  . Drug Use: Not on file  . Sexual Activity: Yes   Other Topics Concern  . Not on file   Social History Narrative     Physical Exam: BP 114/70 mmHg  Pulse 63  Temp(Src) 98.4 F (36.9 C) (Oral)  Resp 14  SpO2 100%  LMP 07/11/2014 (Exact Date) Constitutional: generally well-appearing Psychiatric: alert and oriented x3 Abdomen: soft, nontender, nondistended, no obvious ascites, no peritoneal signs, normal bowel sounds   Assessment and plan: 34 y.o. female with likely esophageal food impaction  Planning on EGD soon.   Rob Bunting, MD [facility] 07/14/2014, 2:41 PM

## 2014-07-14 NOTE — Discharge Instructions (Signed)
Esophagogastroduodenoscopy °Care After °Refer to this sheet in the next few weeks. These instructions provide you with information on caring for yourself after your procedure. Your caregiver may also give you more specific instructions. Your treatment has been planned according to current medical practices, but problems sometimes occur. Call your caregiver if you have any problems or questions after your procedure.  °HOME CARE INSTRUCTIONS °· Do not eat or drink anything until the numbing medicine (local anesthetic) has worn off and your gag reflex has returned. You will know that the local anesthetic has worn off when you can swallow comfortably. °· Do not drive for 12 hours after the procedure or as directed by your caregiver. °· Only take medicines as directed by your caregiver. °SEEK MEDICAL CARE IF:  °· You cannot stop coughing. °· You are not urinating at all or less than usual. °SEEK IMMEDIATE MEDICAL CARE IF: °· You have difficulty swallowing. °· You cannot eat or drink. °· You have worsening throat or chest pain. °· You have dizziness, lightheadedness, or you faint. °· You have nausea or vomiting. °· You have chills. °· You have a fever. °· You have severe abdominal pain. °· You have black, tarry, or bloody stools. °Document Released: 01/04/2012 Document Reviewed: 01/04/2012 °ExitCare® Patient Information ©2015 [employer]. This information is not intended to replace advice given to you by your health care provider. Make sure you discuss any questions you have with your health care provider. ° °

## 2014-07-14 NOTE — ED Notes (Signed)
Pt states last night she ate a piece of steak and it did not go down all th way. pt state this morning unable to swallow any solids or liquids. Denies SOB

## 2014-07-14 NOTE — ED Notes (Signed)
pt not able to tolerate the cocktail , had emesis with drinking a small amount.

## 2014-07-14 NOTE — Op Note (Signed)
[facility] [address] [place], [address]   ENDOSCOPY PROCEDURE REPORT  PATIENT: Casey Berger, Casey Berger  MR#: [id number] BIRTHDATE: 1980-07-25 , 34  yrs. old GENDER: female ENDOSCOPIST: Rachael Fee, MD PROCEDURE DATE:  07/14/2014 PROCEDURE:  EGD w/ fb removal ASA CLASS:     Class II INDICATIONS:  dysphagia, esophageal food impaction. MEDICATIONS: Fentanyl 100 mcg IV and Versed 8 mg IV TOPICAL ANESTHETIC: Cetacaine Spray  DESCRIPTION OF PROCEDURE: After the risks benefits and alternatives of the procedure were thoroughly explained, informed consent was obtained.  The Pentax Gastroscope [id number] endoscope was introduced through the mouth and advanced to the second portion of the duodenum , Without limitations.  The instrument was slowly withdrawn as the mucosa was fully examined.  There was a medium sized food bolus in the distal esophagus.  This was gently pushed into the stomach without signficant resistance. The GE junction was focally, slightly strictured (focal peptic stricture vs.  thick Schatzki's ring) with some acute inflammation, friability.  There was a small (1cm) hiatal hernia.  The examination was otherwisen normal.  Retroflexed views revealed no abnormalities.     The scope was then withdrawn from the patient and the procedure completed.  COMPLICATIONS: There were no immediate complications.  ENDOSCOPIC IMPRESSION: There was a medium sized food bolus in the distal esophagus.  This was gently pushed into the stomach without signficant resistance. The GE junction was focally, slightly strictured (focal peptic stricture vs.  thick Schatzki's ring) with some acute inflammation, friability.  There was a small (1cm) hiatal hernia.  The examination was otherwisen normal  RECOMMENDATIONS: Please start OTC omeprazole (20mg  pill), one pill 20-30 min prior to breakfast and dinner meals for 1 month.  Then OK to decrease to one pill once daily.  My  office will contact you about repeating EGD to dilated the narrowing (in about 3-4 weeks to allow some healing first).  Chew your food well, eat slowly and take small bites in the meantime.   eSigned:  Rachael Fee, MD 07/14/2014 3:11 PM

## 2014-07-14 NOTE — ED Notes (Signed)
Transported to Endo and pt will return post procedure

## 2014-07-14 NOTE — ED Notes (Signed)
Patient transported to X-ray 

## 2014-07-14 NOTE — Telephone Encounter (Signed)
-----   Message from Rachael Fee, MD sent at 07/14/2014  3:23 PM EDT ----- She needs egd with me in 3-4 weeks at [facility] with balloon dilation  tahnks

## 2014-07-14 NOTE — ED Provider Notes (Signed)
CSN: [id number]     Arrival date & time 07/14/14  1117 History   First MD Initiated Contact with Patient 07/14/14 1123     Chief Complaint  Patient presents with  . Foreign Body     (Consider location/radiation/quality/duration/timing/severity/associated sxs/prior Treatment) HPI Comments: ERDINE MARCOE is a 34 y.o. female with a PMHx of HTN, who presents to the ED with complaints of sensation of food stuck in her esophagus just past the sternal notch. She states that last night she was eating steak and feels that a piece did not completely go through thinks that it is stuck in her esophagus. She has no pain, just describes the sensation as tightness, worsens with attempting to swallow liquids, which she states was ineffective in clearing this sensation, and she had to regurgitate the fluids that she consumed. She denies any episode of choking, wheezing, stridor, shortness of breath, or coughing when this episode happened. She denies any fevers or chills, chest pain, shortness of breath, abdominal pain, nausea, vomiting, diarrhea, constipation, melanotic, hematochezia, hematemesis, dysuria, hematuria, vaginal bleeding or discharge, numbness, tingling, weakness, or drooling. She states she feels very anxious because of this sensation.  Patient is a 34 y.o. female presenting with foreign body. The history is provided by the patient. No language interpreter was used.  Foreign Body Location:  Swallowed Suspected object:  Food Pain quality:  Tightness Pain severity:  Mild Duration:  1 day Timing:  Constant Progression:  Unchanged Chronicity:  New Worsened by:  Drinking Ineffective treatments:  Flushing with water Associated symptoms: trouble swallowing (unable to pass liquids without regurgitating)   Associated symptoms: no abdominal pain, no choking, no cough, no difficulty breathing, no drooling, no nausea, no sore throat, no vaginal discharge, no voice change and no vomiting     Past  Medical History  Diagnosis Date  . Hypertension    Past Surgical History  Procedure Laterality Date  . Anterior cruciate ligament repair     History reviewed. No pertinent family history. History  Substance Use Topics  . Smoking status: Never Smoker   . Smokeless tobacco: Not on file  . Alcohol Use: No   OB History    No data available     Review of Systems  Constitutional: Negative for fever and chills.  HENT: Positive for trouble swallowing (unable to pass liquids without regurgitating). Negative for drooling, sore throat and voice change.   Respiratory: Positive for chest tightness (sensation of something stuck just past sternal notch). Negative for cough, choking, shortness of breath, wheezing and stridor.   Cardiovascular: Negative for chest pain.  Gastrointestinal: Negative for nausea, vomiting, abdominal pain, diarrhea and constipation.       +regurgitation of liquids due to FB sensation in esophagus  Genitourinary: Negative for dysuria, hematuria, vaginal bleeding and vaginal discharge.  Musculoskeletal: Negative for myalgias and arthralgias.  Skin: Negative for color change.  Allergic/Immunologic: Negative for immunocompromised state.  Neurological: Negative for weakness and numbness.  Psychiatric/Behavioral: Negative for confusion.   10 Systems reviewed and are negative for acute change except as noted in the HPI.    Allergies  Review of patient's allergies indicates no known allergies.  Home Medications   Prior to Admission medications   Medication Sig Start Date End Date Taking? Authorizing Provider  escitalopram (LEXAPRO) 20 MG tablet Take 20 mg by mouth daily.    Historical Provider, MD  lisinopril-hydrochlorothiazide (PRINZIDE,ZESTORETIC) 20-25 MG per tablet Take 1 tablet by mouth daily.    Historical Provider, MD  meloxicam (MOBIC) 15 MG tablet Take 1 tablet (15 mg total) by mouth daily. 05/30/14   April Palumbo, MD  methocarbamol (ROBAXIN) 500 MG tablet  Take 1 tablet (500 mg total) by mouth 2 (two) times daily. 05/30/14   April Palumbo, MD   BP 158/100 mmHg  Pulse 72  Temp(Src) 98 F (36.7 C) (Oral)  Resp 20  SpO2 100%  LMP 07/11/2014 (Exact Date) Physical Exam  Constitutional: She is oriented to person, place, and time. Vital signs are normal. She appears well-developed and well-nourished.  Non-toxic appearance. No distress.  Afebrile, nontoxic, NAD but anxious appearing, standing in room. Mildly hypertensive but otherwise VSS  HENT:  Head: Normocephalic and atraumatic.  Mouth/Throat: Oropharynx is clear and moist and mucous membranes are normal.  Patent airway. Oropharynx clear and moist, without uvular swelling or deviation, no trismus or drooling, no tonsillar swelling or erythema, no exudates.    Eyes: Conjunctivae and EOM are normal. Right eye exhibits no discharge. Left eye exhibits no discharge.  Neck: Trachea normal and normal range of motion. Neck supple.  No stridor, trachea deviation, or tracheal tenderness.   Cardiovascular: Normal rate, regular rhythm, normal heart sounds and intact distal pulses.  Exam reveals no gallop and no friction rub.   No murmur heard. Pulmonary/Chest: Effort normal and breath sounds normal. No respiratory distress. She has no decreased breath sounds. She has no wheezes. She has no rhonchi. She has no rales. She exhibits no tenderness, no crepitus, no deformity and no retraction.  CTAB in all lung fields, no w/r/r, no hypoxia or increased WOB, speaking in full sentences, SpO2 100% on RA No chest wall TTP, no crepitus or deformity, no retractions  Abdominal: Soft. Normal appearance and bowel sounds are normal. She exhibits no distension. There is no tenderness. There is no rigidity, no rebound, no guarding, no CVA tenderness, no tenderness at McBurney's point and negative Murphy's sign.  Soft, NTND, +BS throughout, no r/g/r, neg murphy's, neg mcburney's, no CVA TTP   Musculoskeletal: Normal range of  motion.  Neurological: She is alert and oriented to person, place, and time. She has normal strength. No sensory deficit.  Skin: Skin is warm, dry and intact. No rash noted.  Psychiatric: Her mood appears anxious.  Anxious appearing  Nursing note and vitals reviewed.   ED Course  Procedures (including critical care time) Labs Review Labs Reviewed - No data to display  Imaging Review Dg Chest 2 View  07/14/2014   CLINICAL DATA:  Solid piece of steak last night and feels like it is stuck in the mid esophagus.  EXAM: CHEST  2 VIEW  COMPARISON:  None.  FINDINGS: Trachea is midline. Heart size normal. Lungs are clear. No pleural fluid.  IMPRESSION: Negative.   Electronically Signed   By: Leanna Battles M.D.   On: 07/14/2014 12:13     EKG Interpretation None      MDM   Final diagnoses:  Foreign body alimentary tract  Dysphagia  Regurgitation    34 y.o. female here with sensation of FB in esophagus. States she had to regurgitate liquids she attempted to swallow this morning. No choking episode, no CP/SOB, just states it feels tight just past the sternal notch. Airway patent, no abd tenderness, no chest wall or trachea tenderness, no wheezing. Will attempt IV glucagon, SL NTG, IV ativan, and get CXR. Will then attempt GI cocktail or soda. Will reassess shortly.   12:49 PM CXR neg for visualization of FB. Still feels like  something may be stuck but wants to proceed with trying GI cocktail followed by soda. If unable to pass without regurgitation, will need to consult GI for possible endoscopy.  1:49 PM Dr. Christella Hartigan returning page, will come down to eval pt and likely plan for endoscopy. Please see his note for further documentation for care.   BP 114/70 mmHg  Pulse 67  Temp(Src) 98.4 F (36.9 C) (Oral)  Resp 16  SpO2 100%  LMP 07/11/2014 (Exact Date)  Meds ordered this encounter  Medications  . calcium gluconate 1 g in sodium chloride 0.9 % 100 mL IVPB    Sig:   .  nitroGLYCERIN (NITROSTAT) SL tablet 0.4 mg    Sig:   . LORazepam (ATIVAN) injection 1 mg    Sig:   . gi cocktail (Maalox,Lidocaine,Donnatal)    Sig:      Allen Derry, PA-C 07/14/14 1409  Richardean Canal, MD 07/14/14 [id number]

## 2014-07-15 ENCOUNTER — Encounter (HOSPITAL_COMMUNITY): Payer: Self-pay | Admitting: Gastroenterology

## 2014-07-15 NOTE — Telephone Encounter (Signed)
Left message with family member to return call.

## 2014-07-16 NOTE — Telephone Encounter (Signed)
Pt has been scheduled for previsit and EGD.  She has been notified

## 2014-08-26 ENCOUNTER — Encounter: Payer: BLUE CROSS/BLUE SHIELD | Admitting: Gastroenterology

## 2015-07-06 ENCOUNTER — Emergency Department (HOSPITAL_BASED_OUTPATIENT_CLINIC_OR_DEPARTMENT_OTHER)
Admission: EM | Admit: 2015-07-06 | Discharge: 2015-07-07 | Disposition: A | Discharge status: Home / Self Care | Payer: BLUE CROSS/BLUE SHIELD | Attending: Emergency Medicine | Admitting: Emergency Medicine

## 2015-07-06 ENCOUNTER — Encounter (HOSPITAL_BASED_OUTPATIENT_CLINIC_OR_DEPARTMENT_OTHER): Payer: Self-pay | Admitting: Emergency Medicine

## 2015-07-06 DIAGNOSIS — I10 Essential (primary) hypertension: Secondary | ICD-10-CM | POA: Insufficient documentation

## 2015-07-06 DIAGNOSIS — E039 Hypothyroidism, unspecified: Secondary | ICD-10-CM | POA: Insufficient documentation

## 2015-07-06 DIAGNOSIS — Z79899 Other long term (current) drug therapy: Secondary | ICD-10-CM | POA: Diagnosis not present

## 2015-07-06 DIAGNOSIS — R05 Cough: Secondary | ICD-10-CM

## 2015-07-06 DIAGNOSIS — R0981 Nasal congestion: Secondary | ICD-10-CM | POA: Insufficient documentation

## 2015-07-06 DIAGNOSIS — R0602 Shortness of breath: Secondary | ICD-10-CM | POA: Insufficient documentation

## 2015-07-06 DIAGNOSIS — R059 Cough, unspecified: Secondary | ICD-10-CM

## 2015-07-06 DIAGNOSIS — R509 Fever, unspecified: Secondary | ICD-10-CM | POA: Diagnosis not present

## 2015-07-06 MED ORDER — BENZONATATE 100 MG PO CAPS: 100.0000 mg | ORAL_CAPSULE | Freq: Three times a day (TID) | ORAL | Status: DC

## 2015-07-06 MED ORDER — BENZONATATE 100 MG PO CAPS
200.0000 mg | ORAL_CAPSULE | Freq: Once | ORAL | Status: AC
  Administered 2015-07-06: 200 mg via ORAL
  Filled 2015-07-06: qty 2

## 2015-07-06 MED ORDER — GUAIFENESIN 100 MG/5ML PO SOLN
5.0000 mL | Freq: Once | ORAL | Status: AC
  Administered 2015-07-06: 100 mg via ORAL
  Filled 2015-07-06: qty 5

## 2015-07-06 MED ORDER — PREDNISONE 50 MG PO TABS
60.0000 mg | ORAL_TABLET | Freq: Once | ORAL | Status: AC
  Administered 2015-07-06: 60 mg via ORAL
  Filled 2015-07-06: qty 1

## 2015-07-06 MED ORDER — GUAIFENESIN ER 600 MG PO TB12: 600.0000 mg | ORAL_TABLET | Freq: Two times a day (BID) | ORAL | Status: DC

## 2015-07-06 MED ORDER — ALBUTEROL SULFATE HFA 108 (90 BASE) MCG/ACT IN AERS: 1.0000 | INHALATION_SPRAY | Freq: Four times a day (QID) | RESPIRATORY_TRACT | Status: DC | PRN

## 2015-07-06 MED ORDER — PREDNISONE 20 MG PO TABS: ORAL_TABLET | ORAL | Status: DC

## 2015-07-06 NOTE — Discharge Instructions (Signed)

## 2015-07-06 NOTE — ED Notes (Signed)
Patient has dry cough for about 1 week.

## 2015-07-06 NOTE — ED Provider Notes (Signed)
CSN: [id number]     Arrival date & time 07/06/15  2249 History  By signing my name below, I, Bethel BornBritney McCollum, attest that this documentation has been prepared under the direction and in the presence of Ramey Ketcherside, MD. Electronically Signed: Bethel BornBritney McCollum, ED Scribe. 07/06/2015. 11:28 PM Chief Complaint  Patient presents with  . Cough   Patient is a 35 y.o. female presenting with cough. The history is provided by the patient. No language interpreter was used.  Cough Cough characteristics:  Dry Severity:  Moderate Onset quality:  Gradual Duration:  1 week Timing:  Intermittent Progression:  Worsening Chronicity:  New Context: sick contacts   Relieved by:  Nothing Worsened by:  Nothing tried Ineffective treatments:  None tried Associated symptoms: no chest pain and no wheezing   Risk factors: no recent travel    Casey Berger is a 35 y.o. female who presents to the Emergency Department complaining of a worsening dry cough with onset 1 week ago. She has not taken anything for her cough at home. Pt states that she has coughing spells that occur without known trigger and have no modifying factors. The cough feels like it comes from her upper airway not her chest.  Associated symptoms include low grade fever and SOB. Pt denies recent long travel. 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   Past Medical History  Diagnosis Date  . Hypertension   . Hypothyroidism    Past Surgical History  Procedure Laterality Date  . Anterior cruciate ligament repair    . Tonsillectomy    . Cesarean section    . Esophagogastroduodenoscopy N/A 07/14/2014    Procedure: ESOPHAGOGASTRODUODENOSCOPY (EGD);  Surgeon: Rachael Feeaniel P Jacobs, MD;  Location: [facility];  Service: Endoscopy;  Laterality: N/A;   History reviewed. No pertinent family history. Social History  Substance Use Topics  . Smoking status: Never Smoker   . Smokeless tobacco: None  . Alcohol  Use: No   OB History    No data available     Review of Systems  HENT: Positive for congestion.   Respiratory: Positive for cough. Negative for wheezing.   Cardiovascular: Negative for chest pain, palpitations and leg swelling.  All other systems reviewed and are negative.  Allergies  Review of patient's allergies indicates no known allergies.  Home Medications   Prior to Admission medications   Medication Sig Start Date End Date Taking? Authorizing Provider  escitalopram (LEXAPRO) 20 MG tablet Take 10 mg by mouth daily.     Historical Provider, MD  hydrochlorothiazide (HYDRODIURIL) 25 MG tablet Take 25 mg by mouth daily. 07/06/14   Historical Provider, MD  levothyroxine (SYNTHROID, LEVOTHROID) 25 MCG tablet Take 37.5 mcg by mouth daily. 06/18/14   Historical Provider, MD  lisinopril (PRINIVIL,ZESTRIL) 20 MG tablet Take 20 mg by mouth daily. 04/14/14   Historical Provider, MD  omeprazole (PRILOSEC) 20 MG capsule Take 1 capsule (20 mg total) by mouth 2 (two) times daily before a meal. 07/14/14   Rachael Feeaniel P Jacobs, MD   BP 152/103 mmHg  Pulse 92  Temp(Src) 99.1 F (37.3 C) (Oral)  Resp 18  Ht 5\' 9"  (1.753 m)  Wt 260 lb (117.935 kg)  BMI 38.38 kg/m2  SpO2 100%  LMP 06/14/2015 Physical Exam  Constitutional: She is oriented to person, place, and time. She appears well-developed and well-nourished.  HENT:  Head: Normocephalic.  Right Ear: Tympanic membrane normal.  Left Ear: Tympanic membrane normal.  Mouth/Throat:  Oropharynx is clear and moist.  Moist mucous membranes No exudate No postnasal drip  Mallampati class 1  Eyes: EOM are normal. Pupils are equal, round, and reactive to light.  Neck: Normal range of motion. No thyromegaly present.  Trachea midline No bruit   Cardiovascular: Normal rate and regular rhythm.  Exam reveals no gallop and no friction rub.   No murmur heard. Pulmonary/Chest: Effort normal and breath sounds normal. No stridor. No respiratory distress. She has  no wheezes. She has no rales. She exhibits no tenderness.  CTAB   Abdominal: Soft. Bowel sounds are normal. She exhibits no mass. There is no tenderness. There is no rebound and no guarding.  Musculoskeletal: Normal range of motion. She exhibits no edema or tenderness.  No LE edema Compartments are soft Intact DP pulses   Neurological: She is alert and oriented to person, place, and time.  Skin: Skin is warm and dry.  Psychiatric: She has a normal mood and affect. Her behavior is normal.  Nursing note and vitals reviewed.   ED Course  Procedures (including critical care time) DIAGNOSTIC STUDIES: Oxygen Saturation is 100% on RA,  normal by my interpretation.    COORDINATION OF CARE: 11:22 PM Discussed treatment plan which includes prednisone, Tessalon, and Robitussin with pt at bedside and pt agreed to plan.  Labs Review Labs Reviewed - No data to display  Imaging Review No results found.   EKG Interpretation None      MDM   Final diagnoses:  None   Filed Vitals:   07/06/15 2256  BP: 152/103  Pulse: 92  Temp: 99.1 F (37.3 C)  Resp: 18   Ginny from [facility] listened to the patient and concurs that lungs are clear.    Medications  predniSONE (DELTASONE) tablet 60 mg (60 mg Oral Given 07/06/15 2334)  benzonatate (TESSALON) capsule 200 mg (200 mg Oral Given 07/06/15 2334)  guaiFENesin (ROBITUSSIN) 100 MG/5ML solution 100 mg (100 mg Oral Given 07/06/15 2335)    Patient has tried nothing for her symptoms.  Will send home with guaifensin, steroids and inhaler PRN.  Follow up with your PMD.  Strict return precautions   I personally performed the services described in this documentation, which was scribed in my presence. The recorded information has been reviewed and is accurate.     Cy Blamer, MD 07/06/15 2355

## 2016-09-27 DIAGNOSIS — K200 Eosinophilic esophagitis: Secondary | ICD-10-CM | POA: Insufficient documentation

## 2017-02-09 IMAGING — CR DG CHEST 2V
2 series · 2 of 2 positions shown · non-contrast
Comparison: None.

CLINICAL DATA: Solid piece of steak last night and feels like it is
stuck in the mid esophagus.

EXAM:
CHEST  2 VIEW

[w chest pa]
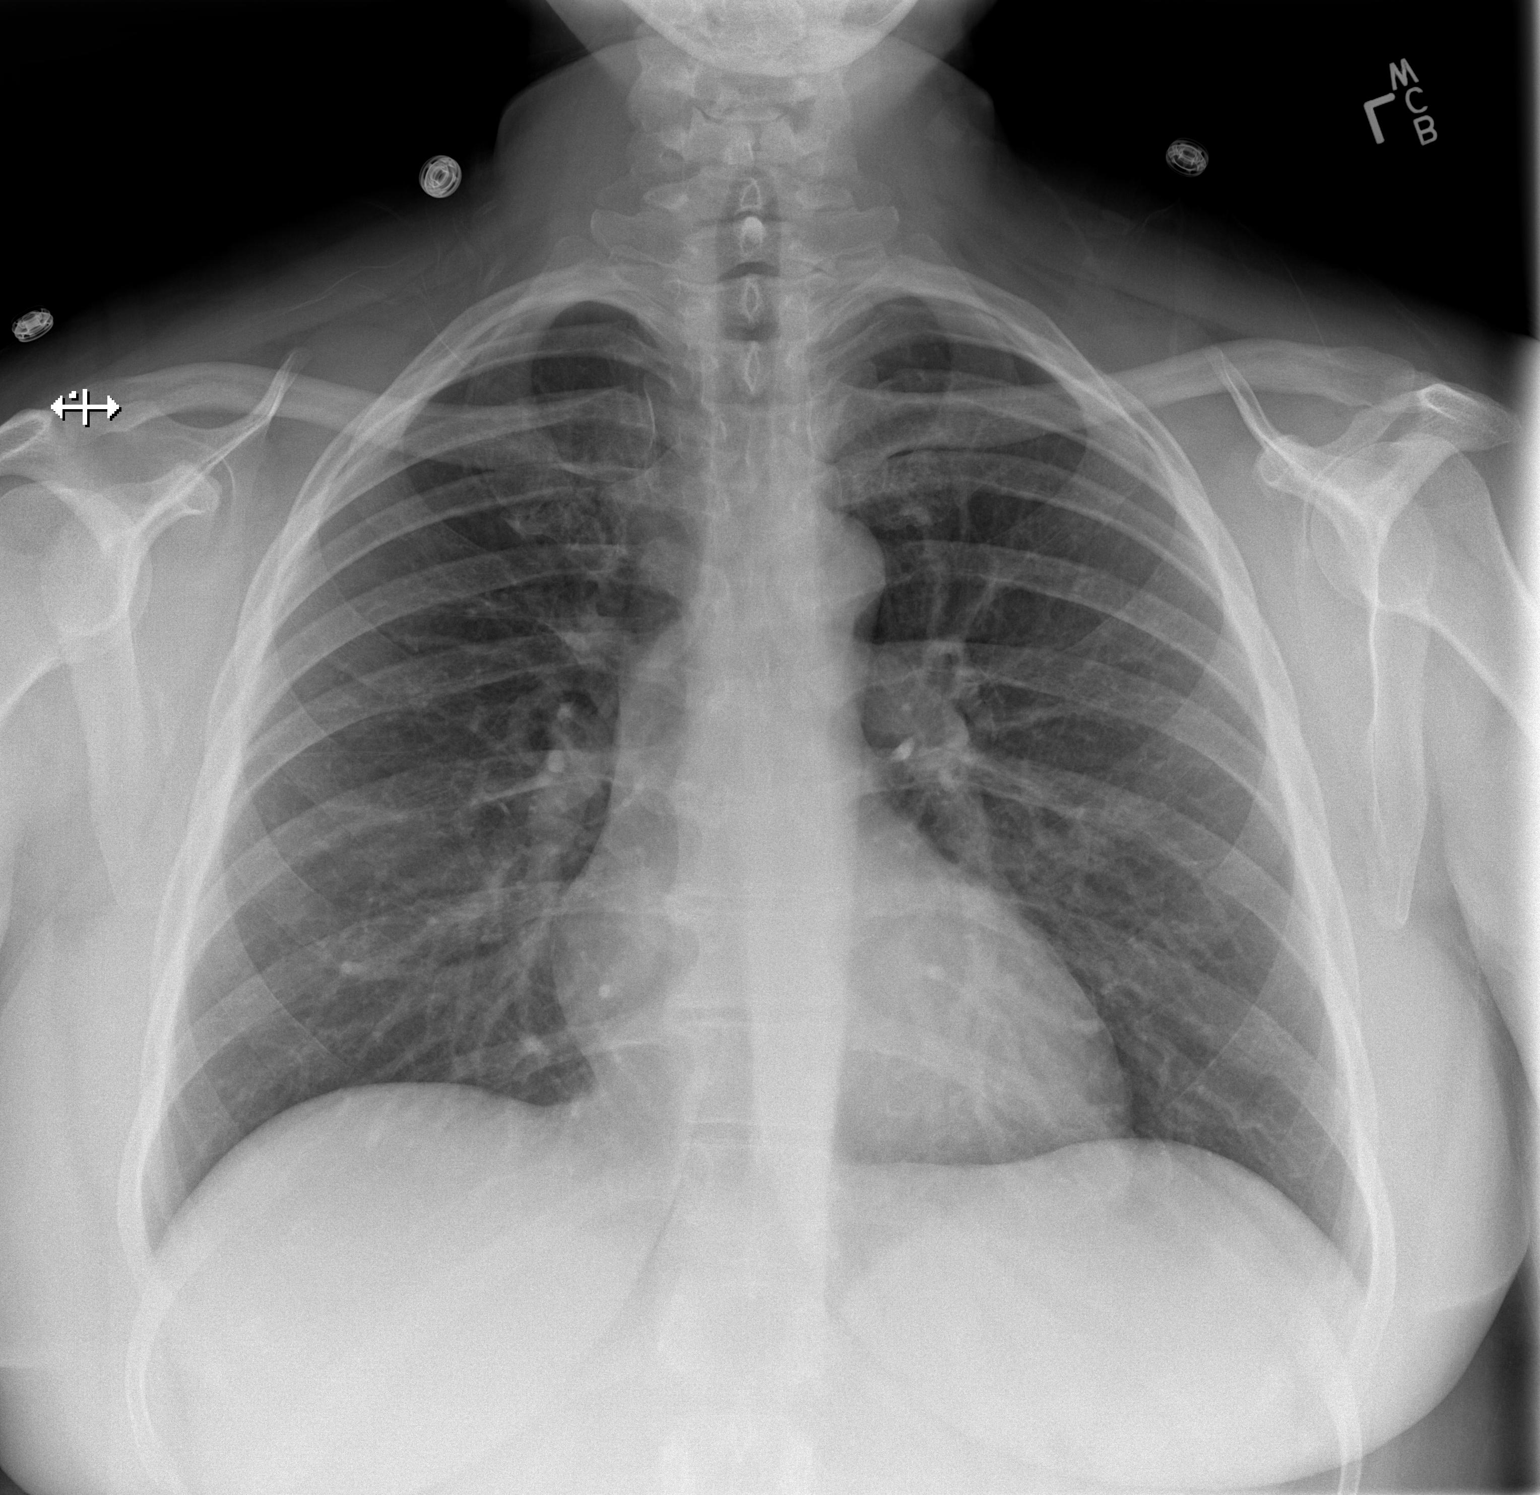

[w chest lat]
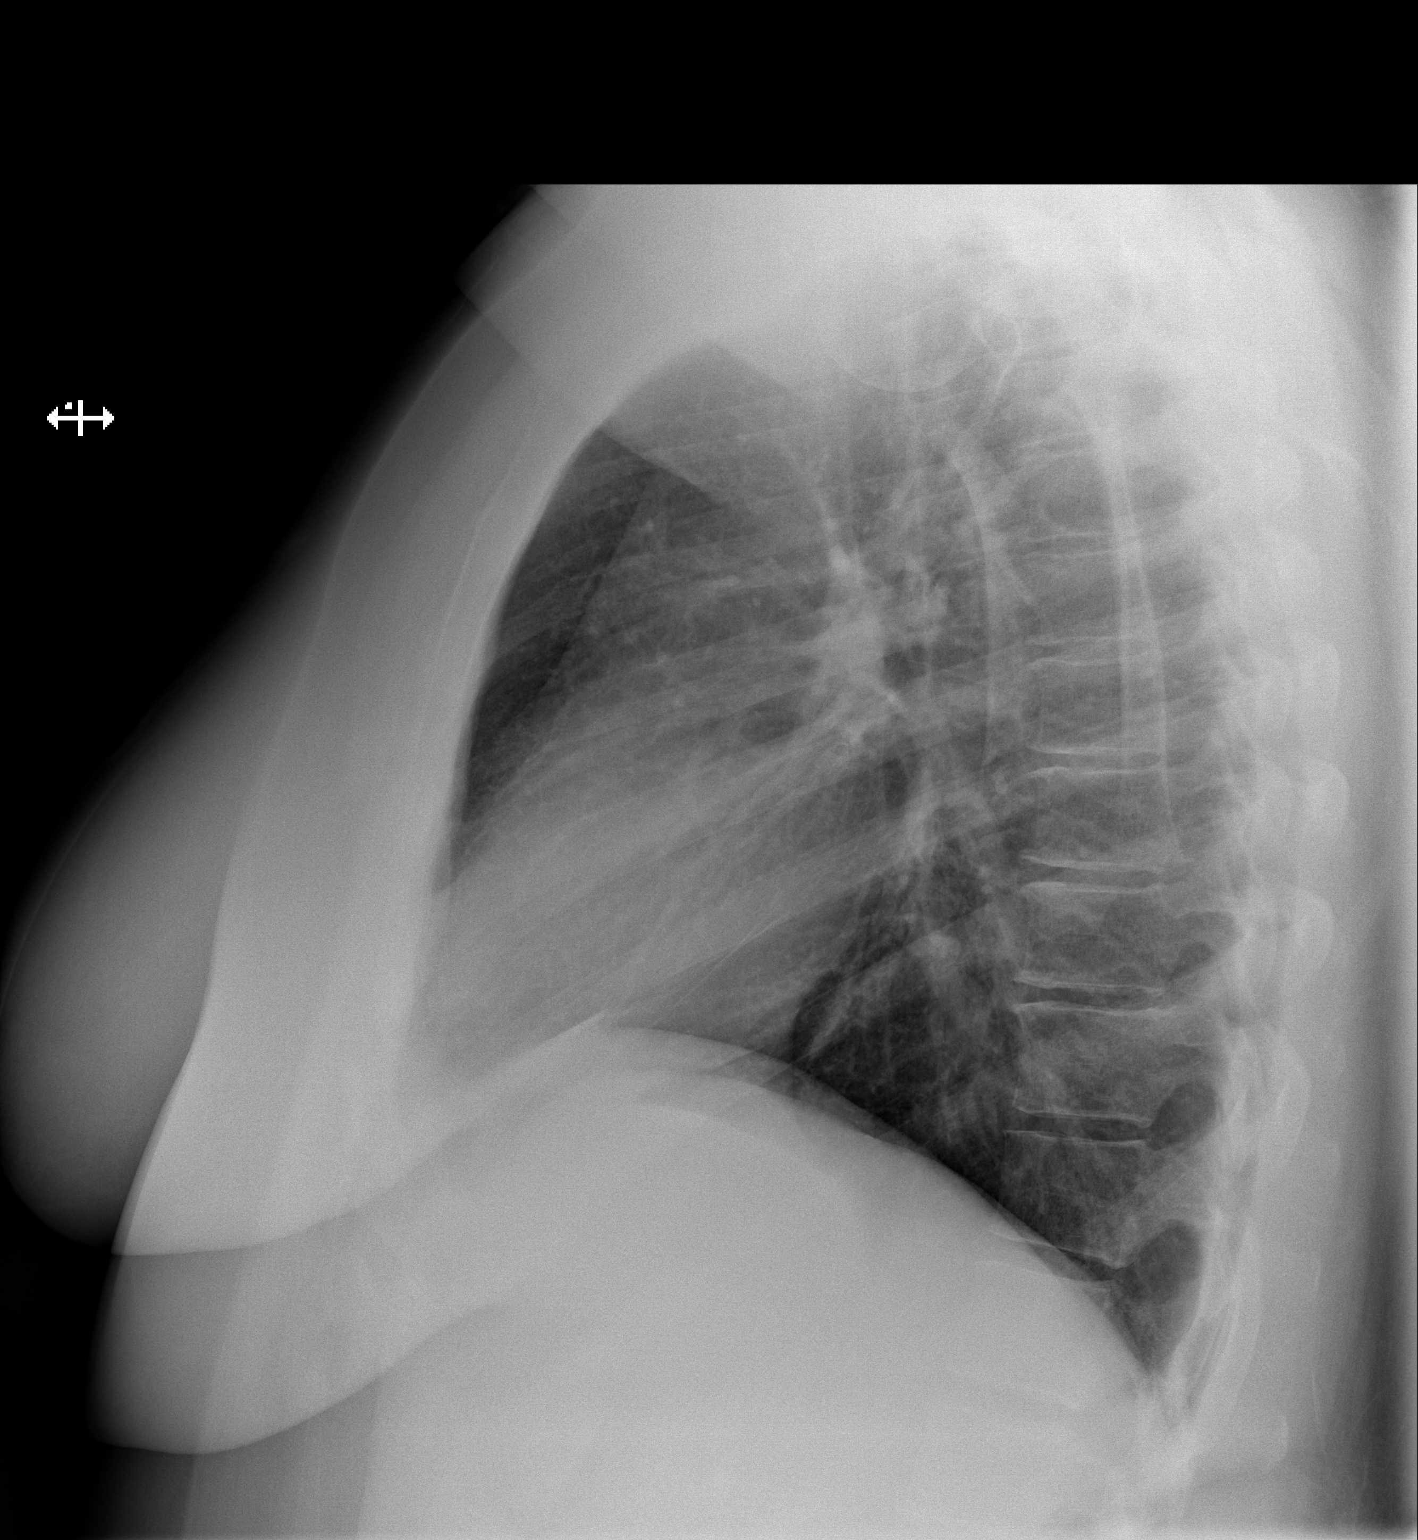

[2 of 2 positions shown; findings below may reference images not displayed]

FINDINGS: Trachea is midline. Heart size normal. Lungs are clear. No pleural
fluid.
IMPRESSION: Negative.

## 2018-01-18 DIAGNOSIS — D500 Iron deficiency anemia secondary to blood loss (chronic): Secondary | ICD-10-CM | POA: Insufficient documentation

## 2019-04-03 LAB — HM PAP SMEAR

## 2019-04-03 LAB — RESULTS CONSOLE HPV: CHL HPV: NEGATIVE

## 2021-09-09 DIAGNOSIS — Z0289 Encounter for other administrative examinations: Secondary | ICD-10-CM

## 2021-09-28 ENCOUNTER — Ambulatory Visit (INDEPENDENT_AMBULATORY_CARE_PROVIDER_SITE_OTHER): Payer: BC Managed Care – PPO | Admitting: Family Medicine

## 2021-09-28 ENCOUNTER — Telehealth (INDEPENDENT_AMBULATORY_CARE_PROVIDER_SITE_OTHER): Payer: Self-pay | Admitting: Psychology

## 2021-09-28 ENCOUNTER — Encounter (INDEPENDENT_AMBULATORY_CARE_PROVIDER_SITE_OTHER): Payer: Self-pay | Admitting: Family Medicine

## 2021-09-28 VITALS — BP 128/76 | HR 66 | Temp 98.3°F | Ht 69.0 in | Wt 281.0 lb

## 2021-09-28 DIAGNOSIS — Z6841 Body Mass Index (BMI) 40.0 and over, adult: Secondary | ICD-10-CM

## 2021-09-28 DIAGNOSIS — I10 Essential (primary) hypertension: Secondary | ICD-10-CM | POA: Diagnosis not present

## 2021-09-28 DIAGNOSIS — E559 Vitamin D deficiency, unspecified: Secondary | ICD-10-CM

## 2021-09-28 DIAGNOSIS — D508 Other iron deficiency anemias: Secondary | ICD-10-CM

## 2021-09-28 DIAGNOSIS — F39 Unspecified mood [affective] disorder: Secondary | ICD-10-CM | POA: Diagnosis not present

## 2021-09-28 DIAGNOSIS — R5383 Other fatigue: Secondary | ICD-10-CM | POA: Diagnosis not present

## 2021-09-28 DIAGNOSIS — R0602 Shortness of breath: Secondary | ICD-10-CM

## 2021-09-28 DIAGNOSIS — Z1331 Encounter for screening for depression: Secondary | ICD-10-CM

## 2021-09-28 DIAGNOSIS — E611 Iron deficiency: Secondary | ICD-10-CM

## 2021-09-28 DIAGNOSIS — Z9189 Other specified personal risk factors, not elsewhere classified: Secondary | ICD-10-CM

## 2021-09-28 DIAGNOSIS — E538 Deficiency of other specified B group vitamins: Secondary | ICD-10-CM

## 2021-09-28 NOTE — Progress Notes (Signed)
Office: [phone]  /  Fax: [phone]    Date: October 05, 2021    Appointment Start Time: 3:01pm Duration: 39 minutes Provider: Glennie Berger, Psy.D. Type of Session: Intake for Individual Therapy  Location of Patient: Home (private location) Location of Provider: Provider's home (private office) Type of Contact: Telepsychological Visit via MyChart Video Visit  Informed Consent: Prior to proceeding with today's appointment, two pieces of identifying information were obtained. In addition, Casey Berger's physical location at the time of this appointment was obtained as well a phone number she could be reached at in the event of technical difficulties. Casey Berger and this provider participated in today's telepsychological service.   The provider's role was explained to Casey Berger. The provider reviewed and discussed issues of confidentiality, privacy, and limits therein (e.g., reporting obligations). In addition to verbal informed consent, written informed consent for psychological services was obtained prior to the initial appointment. Since the clinic is not a 24/7 crisis center, mental health emergency resources were shared and this  provider explained MyChart, e-mail, voicemail, and/or other messaging systems should be utilized only for non-emergency reasons. This provider also explained that information obtained during appointments will be placed in Casey Berger's medical record and relevant information will be shared with other providers at [facility] for coordination of care. Casey Berger agreed information may be shared with other [facility] providers as needed for coordination of care and by signing the service agreement document, she provided written consent for coordination of care. Prior to initiating telepsychological services, Casey Berger completed an informed consent document, which included the development of a safety plan (i.e., an emergency contact and  emergency resources) in the event of an emergency/crisis. Casey Berger verbally acknowledged understanding she is ultimately responsible for understanding her insurance benefits for telepsychological and in-person services. This provider also reviewed confidentiality, as it relates to telepsychological services. Casey Berger  acknowledged understanding that appointments cannot be recorded without both party consent and she is aware she is responsible for securing confidentiality on her end of the session. Casey Berger verbally consented to proceed.  Chief Complaint/HPI: Casey Berger was referred by Casey Berger on September 28, 2021 and her Food and Mood (modified PHQ-9) score was 16.  During today's appointment, Casey Berger shared a history of weight management with Casey Berger for approximately 3 years. She recalled medications prescribed eventually led to weight gain resulting in her seeking "a different approach" and initiating with this clinic. She discussed previously she became "too obsessive" about exercising resulting in her tearing her ACL approximately 10 years ago. She was verbally administered a questionnaire assessing various behaviors related to emotional eating behaviors. Casey Berger endorsed the following: experience food cravings on a regular basis, eat certain foods when you are anxious, stressed, depressed, or your feelings are hurt, use food to help you cope with emotional situations, find food is comforting to you, and not worry about what you eat when you are in a good mood. She shared she craves crunchy and hard foods (e.g., candy) as well as sweets. Casey Berger believes the onset of emotional eating behaviors was likely 15 years ago, noting it was after she had her children. She described the current frequency of emotional eating behaviors as "few times a week." In addition, Casey Berger denied a history of binge eating behaviors. Casey Berger acknowledged when she was exercising excessively, she was not  eating enough. Currently, she stated she will skip meals, but does not engage in significant restriction for weight loss. She denied a history of  purging. Casey Berger noted she has never been diagnosed with an eating disorder; however, she met with a therapist with [facility] to address emotional eating behaviors. Their last appointment was 2-3 months ago, adding they worked together for two years. Additionally, she described herself as an all or nothing thinker. Furthermore, Casey Berger shared a history of depression and anxiety.  Mental Status Examination:  Appearance: neat Behavior: appropriate to circumstances Mood: sad Affect: mood congruent Speech: WNL Eye Contact: appropriate Psychomotor Activity: WNL Gait: unable to assess  Thought Process: linear, logical, and goal directed and denies suicidal, homicidal, and self-harm ideation, plan and intent  Thought Content/Perception: no hallucinations, delusions, bizarre thinking or behavior endorsed or observed Orientation: AAOx4 Memory/Concentration: memory, attention, language, and fund of knowledge intact  Insight/Judgment: fair  Family & Psychosocial History: Casey Berger reported she is married and she has two children (ages 71 and 23). 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 Additionally, Casey Berger shared her highest level of education obtained is an associate's degree. Currently, Casey Berger's social support system consists of her husband and some girlfriends. Moreover, Casey Berger stated she resides with her husband.   Medical History:  Past Medical History:  Diagnosis Date   Anemia    Anxiety    B12 deficiency    Depression    Depression    GERD (gastroesophageal reflux disease)    Hypertension    Hypothyroidism    Joint pain    Palpitations    Swallowing difficulty    Vitamin D deficiency     Past Surgical History:  Procedure Laterality Date   ANTERIOR CRUCIATE LIGAMENT REPAIR     CESAREAN SECTION     ESOPHAGOGASTRODUODENOSCOPY N/A 07/14/2014   Procedure: ESOPHAGOGASTRODUODENOSCOPY (EGD);  Surgeon: Milus Banister, MD;  Location: [facility];  Service: Endoscopy;  Laterality: N/A;   NASAL SINUS SURGERY     TONSILLECTOMY     Current Outpatient Medications on File Prior to Visit  Medication Sig Dispense Refill   albuterol (PROVENTIL HFA;VENTOLIN HFA) 108 (90 Base) MCG/ACT inhaler Inhale 1-2 puffs into the lungs every 6 (six) hours as needed for wheezing or shortness of breath. (Patient not taking: Reported on 09/28/2021) 1 Inhaler 0   benzonatate (TESSALON) 100 MG capsule Take 1 capsule (100 mg total) by mouth every 8 (eight) hours. (Patient not taking: Reported on 09/28/2021) 21 capsule 0   escitalopram (LEXAPRO) 20 MG tablet Take 10 mg by mouth daily.  (Patient not taking: Reported on 09/28/2021)     guaiFENesin (MUCINEX) 600 MG 12 hr tablet Take 1 tablet (600 mg total) by mouth 2 (two) times daily. (Patient not taking: Reported on 09/28/2021) 30 tablet 0   hydrochlorothiazide (HYDRODIURIL) 25 MG tablet Take 25 mg by mouth daily. (Patient not taking: Reported on 09/28/2021)  1   levothyroxine (SYNTHROID, LEVOTHROID) 25 MCG tablet Take 37.5 mcg by mouth daily. (Patient not taking: Reported on 09/28/2021)  7   lisinopril (PRINIVIL,ZESTRIL) 20 MG tablet Take 20 mg by mouth daily.  2   omeprazole (PRILOSEC) 20 MG capsule Take 1 capsule (20 mg total) by mouth 2 (two) times daily before a meal.     predniSONE (DELTASONE) 20 MG tablet 3 tabs po day one, then 2 po daily x 4 days (Patient not taking: Reported on 09/28/2021) 11 tablet 0   No current facility-administered medications on file prior to visit.  Litsy stated  she is medication compliant.  Mental Health History: Kenni reported a history of meeting with a "couple therapists here and there" to address depression and anxiety  prior to the recent therapeutic services with [facility]. She indicated she is currently prescribed Prozac by Dr. Marga Hoots [facility] weight loss clinic). Marimar reported there is no history of hospitalizations for psychiatric concerns. Micheale endorsed a family history of alcoholism (paternal grandmother) and bipolar (maternal uncle). Cristle reported there is no history of trauma including psychological, physical , and sexual abuse, as well as neglect.   Marriah described her typical mood lately as "even keel." However, she described feeling stressed due to recent work and starting a new business. Aside from concerns noted above and endorsed on the PHQ-9 and GAD-7, Janayla reported experiencing concentration challenges due to increased stress, decreased self-confidence, and social withdrawal due to weight. She also described experiencing generalized anxiety and described herself as a "worst case scenario person." Additionally, she indicated she experienced mania-related symptoms (excessive spending, crying spells, feeling she "could just tackle the world") in 2004 after her second child was born, noting she was diagnosed with post partum depression. She indicated that was the first and last time she experienced mania-related symptoms. Enedina denied current alcohol use. She denied tobacco use. She denied illicit/recreational substance use. Furthermore, Casey Berger indicated she is not experiencing the following: hallucinations and delusions, paranoia, crying spells, panic attacks, memory concerns, and obsessions and compulsions. She also denied history of and current suicidal ideation, plan, and intent; history of and current homicidal ideation, plan, and intent; and history of and current engagement in self-harm.  The following strengths were reported by Casey Berger: "very attentive," "very organized," and "creative." The following strengths were observed by this provider: ability to  express thoughts and feelings during the therapeutic session, ability to establish and benefit from a therapeutic relationship, willingness to work toward established goal(s) with the clinic and ability to engage in reciprocal conversation.   Legal History: Jaclyne reported there is no history of legal involvement.   Structured Assessments Results: The Patient Health Questionnaire-9 (PHQ-9) is a self-report measure that assesses symptoms and severity of depression over the course of the last two weeks. Serenitie obtained a score of 12 suggesting moderate depression. Elienai finds the endorsed symptoms to be somewhat difficult. [0= Not at all; 1= Several days; 2= More than half the days; 3= Nearly every day] Little interest or pleasure in doing things 1  Feeling down, depressed, or hopeless 1  Trouble falling or staying asleep, or sleeping too much 0  Feeling tired or having little energy 2  Poor appetite or overeating-fluctuations 2  Feeling bad about yourself --- or that you are a failure or have let yourself or your family down 2  Trouble concentrating on things, such as reading the newspaper or watching television 2  Moving or speaking so slowly that other people could have noticed? Or the opposite --- being so fidgety or restless that you have been moving around a lot more than usual 2  Thoughts that you would be better off dead or hurting yourself in some way 0  PHQ-9 Score 12    The Generalized Anxiety Disorder-7 (GAD-7) is a brief self-report measure that assesses symptoms of anxiety over the course of the last two weeks. Jamile obtained a score of 7 suggesting mild anxiety. Pama finds the endorsed symptoms to be somewhat difficult. [0= Not at all; 1= Several days; 2= Over half the days; 3= Nearly every  day] Feeling nervous, anxious, on edge 2  Not being able to stop or control worrying 1  Worrying too much about different things 1  Trouble relaxing 1  Being so restless that it's  hard to sit still 0  Becoming easily annoyed or irritable 1  Feeling afraid as if something awful might happen 1  GAD-7 Score 7   Interventions:  Conducted a chart review Focused on rapport building Verbally administered PHQ-9 and GAD-7 for symptom monitoring Verbally administered Food & Mood questionnaire to assess various behaviors related to emotional eating Provided emphatic reflections and validation Psychoeducation provided regarding physical versus emotional hunger Recommended/discussed option for longer-term therapeutic services  Diagnostic Impressions & Provisional DSM-5 Diagnosis(es): Abra endorsed a history of engagement in emotional eating behaviors starting approximately 15 years ago and described the current frequency as few times a week. While she disclosed a history of excessive exercising and food restriction approximately 10 years ago for weight loss, she denied current engagement in any other disordered eating behaviors. As such, the following diagnosis was assigned at this time: F50.89 Other Specified Feeding or Eating Disorder, Emotional Eating Behavior. Moreover, Terrace endorsed a history of depression and anxiety. While she described her mood lately as "even keel," she endorsed feeling down and depressed in the last two weeks along with other depression symptoms on the PHQ-9. She also described experiencing generalized anxiety and endorsed items on the GAD-7. Based on the aforementioned, the following diagnoses were assigned: F33.1 Major Depressive Disorder, Recurrent Episode, Moderate and F41.1 Generalized Anxiety Disorder. Traditional therapeutic services were recommended to address symptoms of depression and anxiety.   Plan: Lashae appears able and willing to participate as evidenced by collaboration on a treatment goal, engagement in reciprocal conversation, and asking questions as needed for clarification. The next appointment is scheduled for 10/19/2021 at 4pm,  which will be via MyChart Video Visit. The following treatment goal was established: increase coping skills. This provider will regularly review the treatment plan and medical chart to keep informed of status changes. Jennelle expressed understanding and agreement with the initial treatment plan of care. Avon will be sent a handout via e-mail to utilize between now and the next appointment to increase awareness of hunger patterns and subsequent eating. Casey Berger provided verbal consent during today's appointment for this provider to send the handout via e-mail. Additionally, Casey Berger provided verbal consent for this provider to place a referral with [facility].

## 2021-09-29 LAB — COMPREHENSIVE METABOLIC PANEL
ALT: 15 IU/L (ref 0–32)
AST: 17 IU/L (ref 0–40)
Albumin/Globulin Ratio: 1.5 (ref 1.2–2.2)
Albumin: 4.2 g/dL (ref 3.9–4.9)
Alkaline Phosphatase: 97 IU/L (ref 44–121)
BUN/Creatinine Ratio: 11 (ref 9–23)
BUN: 8 mg/dL (ref 6–24)
Bilirubin Total: 0.4 mg/dL (ref 0.0–1.2)
CO2: 23 mmol/L (ref 20–29)
Calcium: 9 mg/dL (ref 8.7–10.2)
Chloride: 100 mmol/L (ref 96–106)
Creatinine, Ser: 0.73 mg/dL (ref 0.57–1.00)
Globulin, Total: 2.8 g/dL (ref 1.5–4.5)
Glucose: 79 mg/dL (ref 70–99)
Potassium: 4.2 mmol/L (ref 3.5–5.2)
Sodium: 138 mmol/L (ref 134–144)
Total Protein: 7 g/dL (ref 6.0–8.5)
eGFR: 105 mL/min/{1.73_m2} (ref >59)

## 2021-09-29 LAB — CBC WITH DIFFERENTIAL/PLATELET
Basophils Absolute: 0 10*3/uL (ref 0.0–0.2)
Basos: 1 %
EOS (ABSOLUTE): 0.3 10*3/uL (ref 0.0–0.4)
Eos: 7 %
Hematocrit: 43.1 % (ref 34.0–46.6)
Hemoglobin: 14.1 g/dL (ref 11.1–15.9)
Immature Grans (Abs): 0 10*3/uL (ref 0.0–0.1)
Immature Granulocytes: 0 %
Lymphocytes Absolute: 1.4 10*3/uL (ref 0.7–3.1)
Lymphs: 29 %
MCH: 29.4 pg (ref 26.6–33.0)
MCHC: 32.7 g/dL (ref 31.5–35.7)
MCV: 90 fL (ref 79–97)
Monocytes Absolute: 0.2 10*3/uL (ref 0.1–0.9)
Monocytes: 5 %
Neutrophils Absolute: 2.7 10*3/uL (ref 1.4–7.0)
Neutrophils: 58 %
Platelets: 282 10*3/uL (ref 150–450)
RBC: 4.8 x10E6/uL (ref 3.77–5.28)
RDW: 12.6 % (ref 11.7–15.4)
WBC: 4.6 10*3/uL (ref 3.4–10.8)

## 2021-09-29 LAB — INSULIN, RANDOM: INSULIN: 7.6 u[IU]/mL (ref 2.6–24.9)

## 2021-09-29 LAB — TSH: TSH: 1.89 u[IU]/mL (ref 0.450–4.500)

## 2021-09-29 LAB — HEMOGLOBIN A1C
Est. average glucose Bld gHb Est-mCnc: 108 mg/dL
Hgb A1c MFr Bld: 5.4 % (ref 4.8–5.6)

## 2021-09-29 LAB — LIPID PANEL WITH LDL/HDL RATIO
Cholesterol, Total: 199 mg/dL (ref 100–199)
HDL: 54 mg/dL (ref >39)
LDL Chol Calc (NIH): 127 mg/dL — ABNORMAL HIGH (ref 0–99)
LDL/HDL Ratio: 2.4 ratio (ref 0.0–3.2)
Triglycerides: 99 mg/dL (ref 0–149)
VLDL Cholesterol Cal: 18 mg/dL (ref 5–40)

## 2021-09-29 LAB — FOLATE: Folate: 6.3 ng/mL (ref >3.0)

## 2021-09-29 LAB — VITAMIN B12: Vitamin B-12: 548 pg/mL (ref 232–1245)

## 2021-09-29 LAB — VITAMIN D 25 HYDROXY (VIT D DEFICIENCY, FRACTURES): Vit D, 25-Hydroxy: 25.6 ng/mL — ABNORMAL LOW (ref 30.0–100.0)

## 2021-09-29 LAB — T4, FREE: Free T4: 0.99 ng/dL (ref 0.82–1.77)

## 2021-10-05 ENCOUNTER — Telehealth (INDEPENDENT_AMBULATORY_CARE_PROVIDER_SITE_OTHER): Payer: BC Managed Care – PPO | Admitting: Psychology

## 2021-10-05 DIAGNOSIS — F411 Generalized anxiety disorder: Secondary | ICD-10-CM

## 2021-10-05 DIAGNOSIS — F331 Major depressive disorder, recurrent, moderate: Secondary | ICD-10-CM

## 2021-10-05 DIAGNOSIS — F5089 Other specified eating disorder: Secondary | ICD-10-CM

## 2021-10-07 NOTE — Progress Notes (Unsigned)
Chief Complaint:   OBESITY Casey Berger (MR# [id number]) is a 41 y.o. female who presents for evaluation and treatment of obesity and related comorbidities. Current BMI is Body mass index is 41.5 kg/m. Casey Berger has been struggling with her weight for many years and has been unsuccessful in either losing weight, maintaining weight loss, or reaching her healthy weight goal.  Casey Berger is currently in the action stage of change and ready to dedicate time achieving and maintaining a healthier weight. Casey Berger is interested in becoming our patient and working on intensive lifestyle modifications including (but not limited to) diet and exercise for weight loss.  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 Pt wants to be healthier and be more comfortable in her skin. She gained all of her weight after her 2 kids with post-partum depression. Pt was seen at [facility] for weight loss and recently left there. She failed Wegovy, Saxenda, Qsymia- eventually stalled and did not work. Casey Berger was recently put on phentermine and felt out of control. She did [employer] High Protein meals twice a day and dinner. Her worst habits are hard candy and carbs.  Casey Berger's habits were reviewed today and are as follows: Her family eats meals together, she thinks her family will eat healthier with her, she struggles with family and or coworkers weight loss sabotage, her desired weight loss is 82 lbs, she started gaining weight in her late 20's, she is a picky eater and doesn't like to eat healthier foods, she has significant food cravings issues, she snacks frequently in the evenings, she skips meals frequently, she is frequently drinking liquids with calories, she frequently makes poor food choices, she frequently eats larger portions than normal, and she struggles with emotional eating.  Depression Screen Casey Berger's Food and  Mood (modified PHQ-9) score was 16.     09/28/2021    8:51 AM  Depression screen PHQ 2/9  Decreased Interest 3  Down, Depressed, Hopeless 2  PHQ - 2 Score 5  Altered sleeping 1  Tired, decreased energy 2  Change in appetite 3  Feeling bad or failure about yourself  3  Trouble concentrating 1  Moving slowly or fidgety/restless 1  Suicidal thoughts 0  PHQ-9 Score 16  Difficult doing work/chores Very difficult   Subjective:   1. Other fatigue Casey Berger admits to daytime somnolence and admits to waking up still tired. Patient has a history of symptoms of daytime fatigue and morning fatigue. Casey Berger generally gets  6-8  hours of sleep per night, and states that she has generally restful sleep. Snoring is not present. Apneic episodes are not present. Epworth Sleepiness Score is 13- high risk OSA. EKG, normal sinus rhythm without acute abnormalities with rate of 66. IC measured = 2146 with calculated 2205, less than anticipated.   2. SOB (shortness of breath) on exertion Casey Berger notes increasing shortness of breath with exercising and seems to be worsening over time with weight gain. She notes getting out of breath sooner with activity than she used to. This has gotten worse recently. Casey Berger denies shortness of breath at rest or orthopnea.  3. Essential hypertension Cardiovascular ROS: no chest pain or dyspnea on exertion. Medication: lisinopril  4. Mood disorder ([place]) Pt with generalized anxiety disorder and depression. PHQ-9 score = 16  5. Iron deficiency Pt managed by hematologist at [facility], Dr. Harlow Asa.  6. Vitamin D deficiency  She is currently taking no vitamin D supplement. She denies nausea, vomiting or muscle weakness.  7. Vitamin B12 deficiency Casey Berger is not on B12 supplementation.  8. At risk for heart disease Casey Berger is at higher than average risk for cardiovascular disease due to obesity.  Assessment/Plan:   Orders Placed This Encounter   Procedures   VITAMIN D 25 Hydroxy (Vit-D Deficiency, Fractures)   TSH   T4, free   Lipid Panel With LDL/HDL Ratio   Insulin, random   Hemoglobin A1c   Folate   Comprehensive metabolic panel   CBC with Differential/Platelet   Vitamin B12   EKG 12-Lead    There are no discontinued medications.   No orders of the defined types were placed in this encounter.    1. Other fatigue Casey Berger does feel that her weight is causing her energy to be lower than it should be. Fatigue may be related to obesity, depression or many other causes. Labs will be ordered, and in the meanwhile, Casey Berger will focus on self care including making healthy food choices, increasing physical activity and focusing on stress reduction. I recommend pt follow up with PCP for possible OSA evaluation. She will establish with Dr. Nyoka Cowden or Dr. Birdie Riddle in [place].  - EKG 12-Lead  2. SOB (shortness of breath) on exertion Casey Berger does feel that she gets out of breath more easily that she used to when she exercises. Casey Berger's shortness of breath appears to be obesity related and exercise induced. She has agreed to work on weight loss and gradually increase exercise to treat her exercise induced shortness of breath. Will continue to monitor closely.  3. Essential hypertension BP at goal. Casey Berger is working on healthy weight loss and exercise to improve blood pressure control. We will watch for signs of hypotension as she continues her lifestyle modifications.  Lab/Orders today: - Lipid Panel With LDL/HDL Ratio - Insulin, random - Hemoglobin A1c - Comprehensive metabolic panel  4. Mood disorder ([name]) Refer to Dr. [name] and continue meds for now.  Lab/Orders today: - TSH - T4, free  5. Iron deficiency Orders and follow up as documented in patient record.  Counseling Iron is essential for our bodies to make red blood cells.  Reasons that someone may be deficient include: an iron-deficient diet (more likely  in those following vegan or vegetarian diets), women with heavy menses, patients with GI disorders or poor absorption, patients that have had bariatric surgery, frequent blood donors, patients with cancer, and patients with heart disease.   Iron-rich foods include dark leafy greens, red and white meats, eggs, seafood, and beans.   Certain foods and drinks prevent your body from absorbing iron properly. Avoid eating these foods in the same meal as iron-rich foods or with iron supplements. These foods include: coffee, black tea, and red wine; milk, dairy products, and foods that are high in calcium; beans and soybeans; whole grains.  Constipation can be a side effect of iron supplementation. Increased water and fiber intake are helpful. Water goal: > 2 liters/day. Fiber goal: > 25 grams/day.  Lab/Orders today: - Folate - CBC with Differential/Platelet  6. Vitamin D deficiency Low Vitamin D level contributes to fatigue and are associated with obesity, breast, and colon cancer. She will follow-up for routine testing of Vitamin D, at least 2-3 times per year to avoid over-replacement.  Lab/Orders today: - VITAMIN D 25 Hydroxy (Vit-D Deficiency, Fractures)  7. Vitamin B12 deficiency The diagnosis was reviewed with the patient. Counseling provided today, see  below. We will continue to monitor. Orders and follow up as documented in patient record.  Counseling The body needs vitamin B12: to make red blood cells; to make DNA; and to help the nerves work properly so they can carry messages from the brain to the body.  The main causes of vitamin B12 deficiency include dietary deficiency, digestive diseases, pernicious anemia, and having a surgery in which part of the stomach or small intestine is removed.  Certain medicines can make it harder for the body to absorb vitamin B12. These medicines include: heartburn medications; some antibiotics; some medications used to treat diabetes, gout, and high  cholesterol.  In some cases, there are no symptoms of this condition. If the condition leads to anemia or nerve damage, various symptoms can occur, such as weakness or fatigue, shortness of breath, and numbness or tingling in your hands and feet.   Treatment:  May include taking vitamin B12 supplements.  Avoid alcohol.  Eat lots of healthy foods that contain vitamin B12: Beef, pork, chicken, [place], and organ meats, such as liver.  Seafood: This includes clams, rainbow trout, salmon, tuna, and haddock. Eggs.  Cereal and dairy products that are fortified: This means that vitamin B12 has been added to the food.   Lab/Orders today: - Vitamin B12  8. Depression screen Casey Berger had a positive depression screening. Depression is commonly associated with obesity and often results in emotional eating behaviors. We will monitor this closely and work on CBT to help improve the non-hunger eating patterns. Referral to Psychology may be required if no improvement is seen as she continues in our clinic.  9. At risk for heart disease Due to Casey Berger's current state of health and medical condition(s), she is at a higher risk for heart disease.  This puts the patient at much greater risk to subsequently develop cardiopulmonary conditions that can significantly affect patient's quality of life in a negative manner as well.    At least 24 minutes were spent on counseling Casey Berger about these concerns today, and we discussed the importance of reversing risks factors of obesity, especially truncal and visceral fat, hypertension, hyperlipidemia, and pre-diabetes.  The initial goal is to lose at least 5-10% of starting weight to help reduce these risk factors.  Counseling: Intensive lifestyle modifications were discussed with Casey Berger as the most appropriate first line of treatment.  she will continue to work on diet, exercise, and weight loss efforts.  We will continue to reassess these  conditions on a fairly regular basis in an attempt to decrease the patient's overall morbidity and mortality.  Evidence-based interventions for health behavior change were utilized today including the discussion of self monitoring techniques, problem-solving barriers, and SMART goal setting techniques.  Specifically, regarding patient's less desirable eating habits and patterns, we employed the technique of small changes when Casey Berger has not been able to fully commit to her prudent nutritional plan.  10. Class 3 severe obesity with serious comorbidity and body mass index (BMI) of 40.0 to 44.9 in adult, unspecified obesity type (HCC) Casey Berger is currently in the action stage of change and her goal is to continue with weight loss efforts. I recommend Casey Berger begin the structured treatment plan as follows:  She has agreed to the Category 2 Plan.  Exercise goals:  As is    Behavioral modification strategies: no skipping meals and planning for success. Pt states, "This stresses me out and I can't eat all that food." Pt  will focus on eating only dinner on plan at this time.  She was informed of the importance of frequent follow-up visits to maximize her success with intensive lifestyle modifications for her multiple health conditions. She was informed we would discuss her lab results at her next visit unless there is a critical issue that needs to be addressed sooner. Casey Berger agreed to keep her next visit at the agreed upon time to discuss these results.  Objective:   Blood pressure 128/76, pulse 66, temperature 98.3 F (36.8 C), height 5\' 9"  (1.753 m), weight 281 lb (127.5 kg), SpO2 97 %. Body mass index is 41.5 kg/m.  EKG: Normal sinus rhythm, rate 66.  Indirect Calorimeter completed today shows a VO2 of 311 and a REE of 2146.  Her calculated basal metabolic rate is 2147 thus her basal metabolic rate is worse than expected.  General: Cooperative, alert, well developed, in no acute  distress. HEENT: Conjunctivae and lids unremarkable. Cardiovascular: Regular rhythm.  Lungs: Normal work of breathing. Neurologic: No focal deficits.   Lab Results  Component Value Date   CREATININE 0.73 09/28/2021   BUN 8 09/28/2021   NA 138 09/28/2021   K 4.2 09/28/2021   CL 100 09/28/2021   CO2 23 09/28/2021   Lab Results  Component Value Date   ALT 15 09/28/2021   Berger 17 09/28/2021   ALKPHOS 97 09/28/2021   BILITOT 0.4 09/28/2021   Lab Results  Component Value Date   HGBA1C 5.4 09/28/2021   Lab Results  Component Value Date   INSULIN 7.6 09/28/2021   Lab Results  Component Value Date   TSH 1.890 09/28/2021   Lab Results  Component Value Date   CHOL 199 09/28/2021   HDL 54 09/28/2021   LDLCALC 127 (H) 09/28/2021   TRIG 99 09/28/2021   Lab Results  Component Value Date   WBC 4.6 09/28/2021   HGB 14.1 09/28/2021   HCT 43.1 09/28/2021   MCV 90 09/28/2021   PLT 282 09/28/2021    Attestation Statements:   Reviewed by clinician on day of visit: allergies, medications, problem list, medical history, surgical history, family history, social history, and previous encounter notes.  I, 09/30/2021, BS, CMA, am acting as transcriptionist for Kyung Rudd, DO.  I have reviewed the above documentation for accuracy and completeness, and I agree with the above. [employer], D.O.  The 21st Century Cures Act was signed into law in 2016 which includes the topic of electronic health records.  This provides immediate access to information in MyChart.  This includes consultation notes, operative notes, office notes, lab results and pathology reports.  If you have any questions about what you read please let 2017 know at your next visit so we can discuss your concerns and take corrective action if need be.  We are right here with you.

## 2021-10-12 ENCOUNTER — Encounter (INDEPENDENT_AMBULATORY_CARE_PROVIDER_SITE_OTHER): Payer: Self-pay | Admitting: Family Medicine

## 2021-10-12 ENCOUNTER — Ambulatory Visit (INDEPENDENT_AMBULATORY_CARE_PROVIDER_SITE_OTHER): Payer: BC Managed Care – PPO | Admitting: Family Medicine

## 2021-10-12 VITALS — BP 132/86 | HR 68 | Temp 97.9°F | Ht 69.0 in | Wt 278.0 lb

## 2021-10-12 DIAGNOSIS — F39 Unspecified mood [affective] disorder: Secondary | ICD-10-CM

## 2021-10-12 DIAGNOSIS — E7849 Other hyperlipidemia: Secondary | ICD-10-CM

## 2021-10-12 DIAGNOSIS — E559 Vitamin D deficiency, unspecified: Secondary | ICD-10-CM

## 2021-10-12 DIAGNOSIS — E538 Deficiency of other specified B group vitamins: Secondary | ICD-10-CM

## 2021-10-12 DIAGNOSIS — E782 Mixed hyperlipidemia: Secondary | ICD-10-CM

## 2021-10-12 DIAGNOSIS — E8881 Metabolic syndrome: Secondary | ICD-10-CM

## 2021-10-12 DIAGNOSIS — E88819 Insulin resistance, unspecified: Secondary | ICD-10-CM

## 2021-10-12 DIAGNOSIS — E66813 Obesity, class 3: Secondary | ICD-10-CM

## 2021-10-12 DIAGNOSIS — I10 Essential (primary) hypertension: Secondary | ICD-10-CM

## 2021-10-12 DIAGNOSIS — E611 Iron deficiency: Secondary | ICD-10-CM

## 2021-10-12 DIAGNOSIS — E669 Obesity, unspecified: Secondary | ICD-10-CM

## 2021-10-12 DIAGNOSIS — Z9189 Other specified personal risk factors, not elsewhere classified: Secondary | ICD-10-CM

## 2021-10-12 DIAGNOSIS — Z6841 Body Mass Index (BMI) 40.0 and over, adult: Secondary | ICD-10-CM

## 2021-10-12 MED ORDER — VITAMIN D (ERGOCALCIFEROL) 1.25 MG (50000 UNIT) PO CAPS: 50000.0000 [IU] | ORAL_CAPSULE | ORAL | 0 refills | Status: DC

## 2021-10-12 MED ORDER — METFORMIN HCL 500 MG PO TABS: ORAL_TABLET | ORAL | 0 refills | Status: DC

## 2021-10-12 NOTE — Patient Instructions (Signed)
The 10-year ASCVD risk score (Arnett DK, et al., 2019) is: 1%   Values used to calculate the score:     Age: 41 years     Sex: Female     Is Non-Hispanic African American: No     Diabetic: No     Tobacco smoker: No     Systolic Blood Pressure: 132 mmHg     Is BP treated: Yes     HDL Cholesterol: 54 mg/dL     Total Cholesterol: 199 mg/dL

## 2021-10-19 ENCOUNTER — Telehealth (INDEPENDENT_AMBULATORY_CARE_PROVIDER_SITE_OTHER): Payer: BC Managed Care – PPO | Admitting: Psychology

## 2021-10-19 DIAGNOSIS — F331 Major depressive disorder, recurrent, moderate: Secondary | ICD-10-CM

## 2021-10-19 DIAGNOSIS — F411 Generalized anxiety disorder: Secondary | ICD-10-CM | POA: Diagnosis not present

## 2021-10-19 DIAGNOSIS — F5089 Other specified eating disorder: Secondary | ICD-10-CM

## 2021-10-19 NOTE — Progress Notes (Signed)
  Office: [phone]  /  Fax: [phone]    Date: October 19, 2021    Appointment Start Time: 4:00pm Duration: 29 minutes Provider: Glennie Isle, Psy.D. Type of Session: Individual Therapy  Location of Patient: Home (private location) Location of Provider: Provider's Home (private office) Type of Contact: Telepsychological Visit via MyChart Video Visit  Session Content: Casey Berger is a 41 y.o. female presenting for a follow-up appointment to address the previously established treatment goal of increasing coping skills.Today's appointment was a telepsychological visit. [name] provided verbal consent for today's telepsychological appointment and she is aware she is responsible for securing confidentiality on her end of the session. Prior to proceeding with today's appointment, Casey Berger's physical location at the time of this appointment was obtained as well a phone number she could be reached at in the event of technical difficulties. Casey Berger and this provider participated in today's telepsychological service.   This provider conducted a brief check-in. Casey Berger shared about her recent appointment with Casey Berger, noting the structured meal plan is "easier." She also discussed reflecting on emotional and physical hunger. Marquita shared examples. Psychoeducation regarding triggers for emotional eating was provided. Elaura was provided a handout, and encouraged to utilize the handout between now and the next appointment to increase awareness of triggers and frequency. [name] agreed. This provider also discussed behavioral strategies for specific triggers, such as placing the utensil down when conversing to avoid mindless eating. [name] provided verbal consent during today's appointment for this provider to send a handout about triggers via e-mail. Overall, Casey Berger was receptive to today's appointment as evidenced by openness to sharing, responsiveness to feedback, and willingness to  explore triggers for emotional eating.  Mental Status Examination:  Appearance: neat Behavior: appropriate to circumstances Mood: neutral Affect: mood congruent Speech: WNL Eye Contact: appropriate Psychomotor Activity: WNL Gait: unable to assess Thought Process: linear, logical, and goal directed and no evidence or endorsement of suicidal, homicidal, and self-harm ideation, plan and intent  Thought Content/Perception: no hallucinations, delusions, bizarre thinking or behavior endorsed or observed Orientation: AAOx4 Memory/Concentration: memory, attention, language, and fund of knowledge intact  Insight: good Judgment: fair  Interventions:  Conducted a brief chart review Provided empathic reflections and validation Reviewed content from the previous session Provided positive reinforcement Employed supportive psychotherapy interventions to facilitate reduced distress and to improve coping skills with identified stressors Psychoeducation provided regarding triggers for emotional eating behaviors  DSM-5 Diagnosis(es): F50.89 Other Specified Feeding or Eating Disorder, Emotional Eating Behaviors, F33.1  Major Depressive Disorder, Recurrent Episode, Moderate, and F41.1 Generalized Anxiety Disorder   Treatment Goal & Progress: During the initial appointment with this provider, the following treatment goal was established: increase coping skills. Casey Berger has demonstrated progress in her goal as evidenced by increased awareness of hunger patterns.   Plan: The next appointment is scheduled for 11/02/2021 at 4:30pm, which will be via MyChart Video Visit. The next session will focus on working towards the established treatment goal. Casey Berger will complete new patient paperwork for [name] and will initiate therapeutic services.

## 2021-10-20 NOTE — Progress Notes (Signed)
Chief Complaint:   OBESITY Casey Berger is here to discuss her progress with her obesity treatment plan along with follow-up of her obesity related diagnoses. Casey Berger is on the Category 2 Plan and states she is following her eating plan approximately 40% of the time. Casey Berger states she is not currently exercising.  Today's visit was #: 2 Starting weight: 281 lbs Starting date: 09/28/2021 Today's weight: 279 lbs Today's date: 10/13/2021 Total lbs lost to date: 3 Total lbs lost since last in-office visit: 3  Interim History: Casey Berger is here today for her first follow-up office visit since starting the program with us.  All blood work/ lab tests that were recently ordered by myself or an outside provider were reviewed with patient today per their request.   Extended time was spent counseling her on all new disease processes that were discovered or preexisting ones that are affected by BMI.  she understands that many of these abnormalities will need to monitored regularly along with the current treatment plan of prudent dietary changes, in which we are making each and every office visit, to improve these health parameters. Pt was able to eat breakfast on plan. She is following plan 40%, as she has foods in the pantry she needs to eat first, and then go buy foods on plan she needs. Pt denies issues with meal plan, except she is bored with sandwiches.  Subjective:   1. Essential hypertension Discussed labs with patient today. CMP within normal limits. Serum creatinine = 0.73; fasting blood sugar = 79; Medication: Zestril 20 mg  2. Iron deficiency Discussed labs with patient today. CBC completely within normal limits; Pt's fatigue is likely due to lifestyle. She is not on supplementation.  3. Mixed hyperlipidemia New. Discussed labs with patient today. Triglycerides = 99; HDL = 54; Pt is not on statin therapy.  The 10-year ASCVD risk score (Casey Berger, et al., 2019) is: 1%    Values used to calculate the score:     Age: [age] years     Sex: Female     Is Non-Hispanic African American: No     Diabetic: No     Tobacco smoker: No     Systolic Blood Pressure: 132 mmHg     Is BP treated: Yes     HDL Cholesterol: 54 mg/dL     Total Cholesterol: 199 mg/dL  4. Vitamin D deficiency New. Discussed labs with patient today. Casey Berger's Vit D level is 25.6 and she note fatigue. She has no history of supplementation.  5. h/o Vitamin B12 deficiency Discussed labs with patient today. Pt with fatigue. Folate = 6.3; B12 = 548; No  history of supplementation.  6. Insulin resistance New. Discussed labs with patient today. Casey Berger's A1c is 5.4 with an insulin level of 7.6. She has failed Qsymia, phentermine, Wegovy, and Saxenda in the past. Her worst habits are hard candy and carbs. Pt craves carbs/sweets.  7. Mood disorder with emotional eating Discussed labs with patient today. Casey Berger had a video visit with Dr. Dewaine Berger, whom we referred her to last OV. Notes reviewed. Pt wit generalized anxiety disorder, depress, and emotional eating. Her thyroid labs are within normal limits.  8. At risk for diabetes mellitus Casey Berger is at risk for diabetes mellitus due to new onset insulin resistance and history of obesity inactivity.   Assessment/Plan:  No orders of the defined types were placed in this encounter.   Medications Discontinued During This Encounter  Medication Reason  albuterol (PROVENTIL HFA;VENTOLIN HFA) 108 (90 Base) MCG/ACT inhaler    benzonatate (TESSALON) 100 MG capsule    escitalopram (LEXAPRO) 20 MG tablet    guaiFENesin (MUCINEX) 600 MG 12 hr tablet    hydrochlorothiazide (HYDRODIURIL) 25 MG tablet    levothyroxine (SYNTHROID, LEVOTHROID) 25 MCG tablet    predniSONE (DELTASONE) 20 MG tablet      Meds ordered this encounter  Medications   Vitamin D, Ergocalciferol, (DRISDOL) 1.25 MG (50000 UNIT) CAPS capsule    Sig: Take 1 capsule (50,000 Units  total) by mouth every 7 (seven) days.    Dispense:  4 capsule    Refill:  0   metFORMIN (GLUCOPHAGE) 500 MG tablet    Sig: 1 po with lunch daily    Dispense:  30 tablet    Refill:  0    30 d supply;  ** OV for RF **   Do not send RF request     1. Essential hypertension BP above goal at 132/86, as we prefer 130/80 or less. Decrease salt intake, avoid microwave meals, and deli meats high in salt. [name] is working on healthy weight loss and exercise to improve blood pressure control. We will watch for signs of hypotension as she continues her lifestyle modifications.continue meds per PCP.  2. Iron deficiency An OTC iron supplement has been recommended of 325 mg 1-2 times daily with food.  - Iron-rich foods include dark leafy greens, red and white meats, eggs, seafood, and beans   - Vit C can help enhance the absorption of iron - Certain foods and drinks prevent your body from absorbing iron properly.  Avoid eating these foods in the same meal as iron-rich foods or with iron supplements.  These foods include: coffee, black tea, and red wine; milk, dairy products, and foods that are high in calcium; beans and soybeans; whole grains.  - Educated pt that constipation can be a side effect of iron supplementation.  Increased water and fiber intake are helpful.  Water goal: 1/2 of pt's weight in ounces of water per day unless told otherwise by a healthcare provider.  Fiber goal: > 25 grams/day.  3. Mixed hyperlipidemia Pt with elevated LDL at 127. Cardiovascular risk and specific lipid/LDL goals reviewed.  We discussed several lifestyle modifications today and Casey Berger will continue to work on diet, exercise and weight loss efforts. Orders and follow up as documented in patient record.  Counseling Intensive lifestyle modifications are the first line treatment for this issue. Dietary changes: Increase soluble fiber. Decrease simple carbohydrates. Exercise changes: Moderate to vigorous-intensity  aerobic activity 150 minutes per week if tolerated. Lipid-lowering medications: see documented in medical record.Counseling done on decreasing saturated and trans fats and why "lean proteins" are important to choose. Continue weight loss via prudent nutritional plan.  4. Vitamin D deficiency - I discussed the importance of vitamin D to the patient's health and well-being.  - I reviewed possible symptoms of low Vitamin D:  low energy, depressed mood, muscle aches, joint aches, osteoporosis etc. was reviewed with patient - low Vitamin D levels may be linked to an increased risk of cardiovascular events and even increased risk of cancers- such as colon and breast.  - ideal vitamin D levels reviewed with patient  - I recommend pt take a 50,000 IU weekly prescription vit D - see script below   - Informed patient this may be a lifelong thing, and she was encouraged to continue to take the medicine until told otherwise.    -  weight loss will likely improve availability of vitamin D, thus encouraged Casey Berger to continue with meal plan and their weight loss efforts to further improve this condition.  Thus, we will need to monitor levels regularly (every 3-4 mo on average) to keep levels within normal limits and prevent over supplementation. - pt's questions and concerns regarding this condition addressed.  Start- Vitamin D, Ergocalciferol, (DRISDOL) 1.25 MG (50000 UNIT) CAPS capsule; Take 1 capsule (50,000 Units total) by mouth every 7 (seven) days.  Dispense: 4 capsule; Refill: 0  5. h/o Vitamin B12 deficiency B12 at goal. The diagnosis was reviewed with the patient. Counseling provided today, see below. We will continue to monitor. Orders and follow up as documented in patient record. No supplements needed at this time.  Counseling The body needs vitamin B12: to make red blood cells; to make DNA; and to help the nerves work properly so they can carry messages from the brain to the body.  The main causes  of vitamin B12 deficiency include dietary deficiency, digestive diseases, pernicious anemia, and having a surgery in which part of the stomach or small intestine is removed.  Certain medicines can make it harder for the body to absorb vitamin B12. These medicines include: heartburn medications; some antibiotics; some medications used to treat diabetes, gout, and high cholesterol.  In some cases, there are no symptoms of this condition. If the condition leads to anemia or nerve damage, various symptoms can occur, such as weakness or fatigue, shortness of breath, and numbness or tingling in your hands and feet.   Treatment:  May include taking vitamin B12 supplements.  Avoid alcohol.  Eat lots of healthy foods that contain vitamin B12: Beef, pork, chicken, [place], and organ meats, such as liver.  Seafood: This includes clams, rainbow trout, salmon, tuna, and haddock. Eggs.  Cereal and dairy products that are fortified: This means that vitamin B12 has been added to the food.   6. Insulin resistance This is a new diagnosis for Casey Ast - I counseled patient on pathophysiology of the disease process of I.R.   - Stressed importance of dietary and lifestyle modifications to result in weight loss as first line txmnt - In addition, we discussed the risks and benefits of various medication options which can help [place] in the management of this disease process as well as with weight loss.  Will consider starting one of these meds in future, or a dose adjustment in the future, as we will focus on prudent nutritional plan at this time.  - Continue to decrease simple carbs; increase fiber and proteins -> follow her meal plan  - Handouts provided at pt's request after education provided.  All concerns/questions addressed.   - Anticipatory guidance given.   Casey Berger will continue to work on weight loss, exercise, and decreasing simple carbohydrates to help decrease the risk of diabetes.  - We will  recheck A1c and fasting insulin level in approximately 3 months from last check, or as deemed appropriate.  Risks and benefits of Metformin discussed with pt. Handout: Insulin Resistance  Start- metFORMIN (GLUCOPHAGE) 500 MG tablet; 1 po with lunch daily  Dispense: 30 tablet; Refill: 0  7. Mood disorder with emotional eating Discussed how thoughts affect eating habits, modeling of thoughts, feelings, and behaviors, and strategies for change.  Importance of not skipping meals and getting all her proteins and fiber in on a daily basis discussed.   - Discussed cognitive distortions, coping thoughts, and how to change our  thoughts/ self talk regarding foods/ eating patterns. - No SI/ HI.  Mood stable currently - Behavior modification techniques were discussed today to help deal with emotional/ non-hunger eating behaviors including but not limited to exercise for stress management, meditation/prayer, behavorial sessions with her therapist and self care activities like adequate sleep (7-9 hrs/nite). - Importance of following up with PCP and others was stressed  - Educational handouts given to pt at their request - Reminded patient of the importance of following their prudent nutrition plan and how food can affect mood as well to support emotional wellbeing.  - We will continue to monitor closely alongside PCP / other specialists.  Refill- FLUoxetine (PROZAC) 40 MG capsule; Take 40 mg by mouth daily.  8. At risk for diabetes mellitus - Casey Berger was given diabetes prevention education and counseling today of more than 24 minutes.  - Counseled patient on pathophysiology of disease and meaning/ implication of lab results.  - Reviewed how certain foods can either stimulate or inhibit insulin release, and subsequently affect hunger pathways  - Importance of following a healthy meal plan with limiting amounts of simple carbohydrates discussed with patient - Effects of regular aerobic exercise on blood  sugar regulation reviewed and encouraged an eventual goal of 30 min 5d/week or more as a minimum.  - Briefly discussed treatment options, which always include dietary and lifestyle modification as first line.   - Handouts provided at patient's desire and/or told to go online to the American Diabetes Association website for further information.  9. Obesity, current BMI 41.1 Casey Berger is currently in the action stage of change. As such, her goal is to continue with weight loss efforts. She has agreed to the Category 2 Plan and ADD lunch options.   Handouts on: Insulin Resistance and Pre-DM  Exercise goals:  As is  Behavioral modification strategies: increasing lean protein intake and increasing water intake.  Casey Berger has agreed to follow-up with our clinic in 2-3 weeks. She was informed of the importance of frequent follow-up visits to maximize her success with intensive lifestyle modifications for her multiple health conditions.    Objective:   Blood pressure 132/86, pulse 68, temperature 97.9 F (36.6 C), height 5\' 9"  (1.753 m), weight 278 lb (126.1 kg), SpO2 97 %. Body mass index is 41.05 kg/m.  General: Cooperative, alert, well developed, in no acute distress. HEENT: Conjunctivae and lids unremarkable. Cardiovascular: Regular rhythm.  Lungs: Normal work of breathing. Neurologic: No focal deficits.   Lab Results  Component Value Date   CREATININE 0.73 09/28/2021   BUN 8 09/28/2021   NA 138 09/28/2021   K 4.2 09/28/2021   CL 100 09/28/2021   CO2 23 09/28/2021   Lab Results  Component Value Date   ALT 15 09/28/2021   AST 17 09/28/2021   ALKPHOS 97 09/28/2021   BILITOT 0.4 09/28/2021   Lab Results  Component Value Date   HGBA1C 5.4 09/28/2021   Lab Results  Component Value Date   INSULIN 7.6 09/28/2021   Lab Results  Component Value Date   TSH 1.890 09/28/2021   Lab Results  Component Value Date   CHOL 199 09/28/2021   HDL 54 09/28/2021   LDLCALC 127 (H)  09/28/2021   TRIG 99 09/28/2021   Lab Results  Component Value Date   VD25OH 25.6 (L) 09/28/2021   Lab Results  Component Value Date   WBC 4.6 09/28/2021   HGB 14.1 09/28/2021   HCT 43.1 09/28/2021   MCV 90 09/28/2021  PLT 282 09/28/2021    Attestation Statements:   Reviewed by clinician on day of visit: allergies, medications, problem list, medical history, surgical history, family history, social history, and previous encounter notes.  I, Kyung Rudd, BS, CMA, am acting as transcriptionist for [employer], DO.   I have reviewed the above documentation for accuracy and completeness, and I agree with the above. Carlye Grippe, D.O.  The 21st Century Cures Act was signed into law in 2016 which includes the topic of electronic health records.  This provides immediate access to information in MyChart.  This includes consultation notes, operative notes, office notes, lab results and pathology reports.  If you have any questions about what you read please let us know at your next visit so we can discuss your concerns and take corrective action if need be.  We are right here with you.

## 2021-10-26 ENCOUNTER — Ambulatory Visit: Payer: BC Managed Care – PPO | Admitting: Physician Assistant

## 2021-10-27 ENCOUNTER — Encounter: Payer: Self-pay | Admitting: Physician Assistant

## 2021-10-27 ENCOUNTER — Ambulatory Visit: Payer: BC Managed Care – PPO | Admitting: Physician Assistant

## 2021-10-27 VITALS — BP 116/70 | HR 68 | Temp 98.0°F | Ht 68.5 in | Wt 282.5 lb

## 2021-10-27 DIAGNOSIS — I10 Essential (primary) hypertension: Secondary | ICD-10-CM | POA: Diagnosis not present

## 2021-10-27 DIAGNOSIS — K200 Eosinophilic esophagitis: Secondary | ICD-10-CM | POA: Diagnosis not present

## 2021-10-27 DIAGNOSIS — F419 Anxiety disorder, unspecified: Secondary | ICD-10-CM | POA: Diagnosis not present

## 2021-10-27 DIAGNOSIS — F32A Anxiety disorder, unspecified: Secondary | ICD-10-CM

## 2021-10-27 DIAGNOSIS — E669 Obesity, unspecified: Secondary | ICD-10-CM | POA: Diagnosis not present

## 2021-10-27 DIAGNOSIS — D500 Iron deficiency anemia secondary to blood loss (chronic): Secondary | ICD-10-CM

## 2021-10-27 MED ORDER — OMEPRAZOLE 20 MG PO CPDR: 20.0000 mg | DELAYED_RELEASE_CAPSULE | ORAL | Status: AC

## 2021-10-27 NOTE — Progress Notes (Signed)
Casey Berger is a 41 y.o. female here for a follow up of a pre-existing problem.  History of Present Illness:   Chief Complaint  Patient presents with   Establish Care    HPI  Obesity Started gaining weight after having kids (they are currently 19 and 20yo) Worked out regularly and was maintaining weight of 175 lb for a bit but then developed significant knee issues and had to stop exercising. She was being followed at [facility] and then switched to [facility] -- she reportedly "failed Wegovy, Saxenda, Qsymia- eventually stalled and did not work. Casey Berger was recently put on phentermine and felt out of control." She states that even on max doses of Saxenda and Wegovy she didn't feel any effects whatsoever. She was recently told that she has insulin resistance and is now on Metformin 500 mg daily.  Depression and Anxiety She is currently taking Prozac. She was on Prozac 20 mg for a long time and then increased to Prozac 40 mg daily in the past two years. Overall well controlled. Denies SI/HI. Saw talk therapist when she was at [facility] and this was very helpful for her.  HTN Currently taking lisinopril 20 mg. At home blood pressure readings are: WNL. Patient denies chest pain, SOB, blurred vision, dizziness, unusual headaches, lower leg swelling. Patient is compliant with medication. Denies excessive caffeine intake, stimulant usage, excessive alcohol intake, or increase in salt consumption.  BP Readings from Last 3 Encounters:  10/27/21 116/70  10/12/21 132/86  09/28/21 128/76    Eosinophilic Esophagitis Had esophageal dilation 4 years ago. Is taking prilosec 20 mg every other day. Slowly trying to wean herself off. Overall well controlled. No confirmed celiac hx.  Anemia Has had 6-7 iron transfusions. Does not have heavy periods. Tried oral iron but didn't absorb well. Followed by hematology. Has issues where her blood levels  "crash" and she has severe fatigue and then she gets tanked up with her transfusion. Endoscopy and colonoscopy were unrevealing; may need to do capsule study in future.   Past Medical History:  Diagnosis Date   Anemia    Anxiety    B12 deficiency    Depression    GERD (gastroesophageal reflux disease)    Hypertension    Joint pain    Palpitations    Swallowing difficulty    Vitamin D deficiency      Social History   Tobacco Use   Smoking status: Never   Smokeless tobacco: Never  Vaping Use   Vaping Use: Never used  Substance Use Topics   Alcohol use: Yes    Comment: rarely   Drug use: Never    Past Surgical History:  Procedure Laterality Date   ANTERIOR CRUCIATE LIGAMENT REPAIR     CESAREAN SECTION     ESOPHAGOGASTRODUODENOSCOPY N/A 07/14/2014   Procedure: ESOPHAGOGASTRODUODENOSCOPY (EGD);  Surgeon: Rachael Fee, MD;  Location: [facility];  Service: Endoscopy;  Laterality: N/A;   NASAL SINUS SURGERY     TONSILLECTOMY     TUBAL LIGATION  2004    Family History  Problem Relation Age of Onset   High Cholesterol Mother    Other Mother        pre-DM   High blood pressure Father    High Cholesterol Father    Heart disease Father    Sleep apnea Father    Sleep apnea Brother    Hypercholesterolemia Brother    Hypertension Maternal Grandmother  Pulmonary fibrosis Maternal Grandmother    Valvular heart disease Maternal Grandmother        MVP   Heart attack Maternal Grandfather 68   Stroke Maternal Grandfather    Colon cancer Paternal Grandfather    Diabetes Paternal Grandfather     No Known Allergies  Current Medications:   Current Outpatient Medications:    FLUoxetine (PROZAC) 40 MG capsule, Take 40 mg by mouth daily., Disp: , Rfl:    lisinopril (PRINIVIL,ZESTRIL) 20 MG tablet, Take 20 mg by mouth daily., Disp: , Rfl: 2   metFORMIN (GLUCOPHAGE) 500 MG tablet, 1 po with lunch daily, Disp: 30 tablet, Rfl: 0   Vitamin D, Ergocalciferol, (DRISDOL) 1.25  MG (50000 UNIT) CAPS capsule, Take 1 capsule (50,000 Units total) by mouth every 7 (seven) days., Disp: 4 capsule, Rfl: 0   omeprazole (PRILOSEC) 20 MG capsule, Take 1 capsule (20 mg total) by mouth every other day., Disp: , Rfl:    Review of Systems:   ROS Negative unless otherwise specified per HPI.  Vitals:   Vitals:   10/27/21 1438  BP: 116/70  Pulse: 68  Temp: 98 F (36.7 C)  TempSrc: Temporal  SpO2: 97%  Weight: 282 lb 8 oz (128.1 kg)  Height: 5' 8.5" (1.74 m)     Body mass index is 42.33 kg/m.  Physical Exam:   Physical Exam Vitals and nursing note reviewed.  Constitutional:      General: She is not in acute distress.    Appearance: She is well-developed. She is not ill-appearing or toxic-appearing.  Cardiovascular:     Rate and Rhythm: Normal rate and regular rhythm.     Pulses: Normal pulses.     Heart sounds: Normal heart sounds, S1 normal and S2 normal.  Pulmonary:     Effort: Pulmonary effort is normal.     Breath sounds: Normal breath sounds.  Skin:    General: Skin is warm and dry.  Neurological:     Mental Status: She is alert.     GCS: GCS eye subscore is 4. GCS verbal subscore is 5. GCS motor subscore is 6.  Psychiatric:        Speech: Speech normal.        Behavior: Behavior normal. Behavior is cooperative.     Assessment and Plan:   Obesity, unspecified classification, unspecified obesity type, unspecified whether serious comorbidity present [facility] from [facility] -- she is not doing well with this program, finds the diet very restrictive and is not having good results Will refer to [facility] to help provide different approach  Anxiety and depression Well controlled Continue prozac 40 mg daily Follow-up in 6 months, sooner if needed  Essential hypertension Normotensive Continue lisinopril 20 mg daily Follow-up in 6 months, sooner if needed  Esophagitis, eosinophilic Mgmt per GI  Iron deficiency anemia due to  chronic blood loss Mgmt per heme  Time spent with patient today was 56 minutes which consisted of chart review, discussing diagnosis, work up, treatment answering questions and documentation.   Inda Coke, PA-C

## 2021-10-27 NOTE — Patient Instructions (Signed)
It was great to see you!  Let's get you to [facility] w/ Dr Juleen China! I will place referral but you can also reach out to them.   Take care,  Inda Coke PA-C

## 2021-10-27 NOTE — Addendum Note (Signed)
Addended by: Erlene Quan on: 10/27/2021 04:13 PM   Modules accepted: Orders

## 2021-10-28 ENCOUNTER — Other Ambulatory Visit (HOSPITAL_BASED_OUTPATIENT_CLINIC_OR_DEPARTMENT_OTHER): Payer: Self-pay | Admitting: Physician Assistant

## 2021-10-28 DIAGNOSIS — Z1231 Encounter for screening mammogram for malignant neoplasm of breast: Secondary | ICD-10-CM

## 2021-11-01 ENCOUNTER — Ambulatory Visit (HOSPITAL_BASED_OUTPATIENT_CLINIC_OR_DEPARTMENT_OTHER)
Admission: RE | Admit: 2021-11-01 | Discharge: 2021-11-01 | Disposition: A | Discharge status: Home / Self Care | Payer: BC Managed Care – PPO | Source: Ambulatory Visit

## 2021-11-01 ENCOUNTER — Encounter: Payer: Self-pay | Admitting: Physician Assistant

## 2021-11-01 ENCOUNTER — Telehealth: Payer: Self-pay | Admitting: Physician Assistant

## 2021-11-01 DIAGNOSIS — Z1231 Encounter for screening mammogram for malignant neoplasm of breast: Secondary | ICD-10-CM

## 2021-11-01 DIAGNOSIS — F39 Unspecified mood [affective] disorder: Secondary | ICD-10-CM

## 2021-11-01 DIAGNOSIS — R92323 Mammographic fibroglandular density, bilateral breasts: Secondary | ICD-10-CM

## 2021-11-01 NOTE — Telephone Encounter (Signed)
Patient requests Referral for Mammogram for diagnostic screening on the Referral be [facility] to [facility].  Patient requests to be called at ph# [phone] to be advised on status of above request.

## 2021-11-02 ENCOUNTER — Telehealth (INDEPENDENT_AMBULATORY_CARE_PROVIDER_SITE_OTHER): Payer: BC Managed Care – PPO | Admitting: Psychology

## 2021-11-02 NOTE — Telephone Encounter (Signed)
Spoke to pt asked her if they did the Mammogram? Pt said no, because she needs order for Diagnostic Mammo due to hx of dense tissue of breasts. Told her okay I will place order now and send to [facility] and someone will contact you to schedule an appt. Pt verbalized understanding. Order put in [facility].

## 2021-11-03 NOTE — Telephone Encounter (Signed)
Spoke to pt told her she will need to get her films transferred over to the imaging center in order for them to do imaging because they need to compare prior films that is there policy. Pt verbalized understanding and said she will get the films from her previous Mammogram sent over,but they also told her the order had to be written different. Told her I will call the imaging center tomorrow and see what they need and I will let them know you are working on getting your films transferred to them. Pt verbalized understanding.

## 2021-11-04 ENCOUNTER — Ambulatory Visit (INDEPENDENT_AMBULATORY_CARE_PROVIDER_SITE_OTHER): Payer: BC Managed Care – PPO | Admitting: Family Medicine

## 2021-11-04 NOTE — Telephone Encounter (Addendum)
Pt is requesting have her mammogram order sent to [facility] at the following fax number: [phone].  Phone number is [phone]

## 2021-11-05 NOTE — Addendum Note (Signed)
Addended by: Marian Sorrow on: 11/05/2021 08:54 AM   Modules accepted: Orders

## 2021-11-05 NOTE — Telephone Encounter (Signed)
Left message on voicemail to call office.  

## 2021-11-05 NOTE — Telephone Encounter (Signed)
Orders for U/S if needed faxed to [facility].

## 2021-11-05 NOTE — Telephone Encounter (Signed)
Pt called back told her I faxed order over to [facility] for Mammogram. If they have a problem with the order please tell them to call me. Pt verbalized understanding.

## 2021-11-05 NOTE — Telephone Encounter (Signed)
See other message

## 2021-11-05 NOTE — Telephone Encounter (Signed)
Pt called back told her I faxed order for U/S over to imaging. Pt verbalized understanding.

## 2021-11-16 ENCOUNTER — Other Ambulatory Visit: Payer: Self-pay | Admitting: Physician Assistant

## 2021-11-16 DIAGNOSIS — E559 Vitamin D deficiency, unspecified: Secondary | ICD-10-CM

## 2021-11-16 LAB — HM MAMMOGRAPHY

## 2021-11-16 MED ORDER — VITAMIN D (ERGOCALCIFEROL) 1.25 MG (50000 UNIT) PO CAPS: 50000.0000 [IU] | ORAL_CAPSULE | ORAL | 0 refills | Status: AC

## 2021-11-17 ENCOUNTER — Encounter: Payer: Self-pay | Admitting: Physician Assistant

## 2021-12-13 MED ORDER — FLUOXETINE HCL 40 MG PO CAPS: 40.0000 mg | ORAL_CAPSULE | Freq: Every day | ORAL | 1 refills | Status: AC

## 2022-02-09 ENCOUNTER — Other Ambulatory Visit: Payer: Self-pay | Admitting: Physician Assistant

## 2022-02-09 DIAGNOSIS — E559 Vitamin D deficiency, unspecified: Secondary | ICD-10-CM

## 2022-05-09 ENCOUNTER — Other Ambulatory Visit (HOSPITAL_COMMUNITY): Payer: Self-pay

## 2022-05-09 MED ORDER — LISDEXAMFETAMINE DIMESYLATE 30 MG PO CAPS
30.0000 mg | ORAL_CAPSULE | Freq: Every morning | ORAL | 0 refills | Status: DC
  Filled 2022-05-09: qty 30, 30d supply, fill #0

## 2022-05-17 ENCOUNTER — Ambulatory Visit: Payer: BC Managed Care – PPO | Admitting: Family Medicine

## 2022-05-17 ENCOUNTER — Encounter: Payer: Self-pay | Admitting: Family Medicine

## 2022-05-17 VITALS — BP 130/84 | HR 71 | Temp 98.2°F | Ht 68.5 in | Wt 269.2 lb

## 2022-05-17 DIAGNOSIS — J069 Acute upper respiratory infection, unspecified: Secondary | ICD-10-CM

## 2022-05-17 DIAGNOSIS — H66002 Acute suppurative otitis media without spontaneous rupture of ear drum, left ear: Secondary | ICD-10-CM

## 2022-05-17 DIAGNOSIS — J029 Acute pharyngitis, unspecified: Secondary | ICD-10-CM

## 2022-05-17 LAB — POCT INFLUENZA A/B: Influenza A, POC: NEGATIVE

## 2022-05-17 LAB — POC COVID19 BINAXNOW: SARS Coronavirus 2 Ag: NEGATIVE

## 2022-05-17 LAB — POCT RAPID STREP A (OFFICE): Rapid Strep A Screen: NEGATIVE

## 2022-05-17 MED ORDER — METHYLPREDNISOLONE ACETATE 80 MG/ML IJ SUSP
80.0000 mg | Freq: Once | INTRAMUSCULAR | Status: AC
  Administered 2022-05-17: 80 mg via INTRAMUSCULAR

## 2022-05-17 NOTE — Patient Instructions (Signed)
Please follow up if symptoms do not improve or as needed.    I recommend Delsym or mucinex DM for cough and congestion.  Advil for sore throat and aches/clamminess Steroid injection should help with the sinuses and ear pressure.  Complete the antibiotics as ordered.

## 2022-05-17 NOTE — Progress Notes (Signed)
Subjective  CC:  Chief Complaint  Patient presents with   Cough    Pt stated that she has been coughing since 05/11/2021 along with sore throat runny nose and has not gotten any better. Pt has been tested for COVID(Neg).    Same day acute visit; PCP not available. New pt to me. Chart reviewed.   HPI: Casey Berger is a 42 y.o. female who presents to the office today to address the problems listed above in the chief complaint. 42 year old female with upper respiratory symptoms for the last 6 days..  Patient currently in the process of moving to [place].  She flew to [place] last week, awoke with sore throat and congestion.  Urgent care evaluation at that time diagnosed and treated her for an ear infection with amoxicillin 875 twice daily.  She continues on his antibiotic but symptoms have progressed, sore throat persist, nasal congestion without sinus pain, clamminess possible low-grade fevers with cough, nonproductive.  No shortness of breath.  No fatigue.  No headache.  No GI symptoms.  No wheezing.  Assessment  1. Left acute suppurative otitis media   2. Sore throat   3. Viral URI with cough      Plan  Left otitis media and URI with cough: Recommend completing course of amoxicillin and follow-up as needed.  Depo-Medrol 80 mg IM given to help with ear infection, sinus symptoms and sore throat.  Risk versus benefits discussed.  Patient tolerated well.  Recommend cough syrup and decongestant as tolerated.  Follow-up if unimproved.  Follow up: As needed Visit date not found  Orders Placed This Encounter  Procedures   POC COVID-19   POCT rapid strep A   POCT Influenza A/B   Meds ordered this encounter  Medications   methylPREDNISolone acetate (DEPO-MEDROL) injection 80 mg      I reviewed the patients updated PMH, FH, and SocHx.    Patient Active Problem List   Diagnosis Date Noted   Iron deficiency anemia due to chronic blood loss 01/18/2018   Esophagitis, eosinophilic  09/27/2016   Esophageal obstruction due to food impaction 07/14/2014   Essential hypertension 10/25/2011   Depression 07/12/2011   Anxiety 07/12/2011   Current Meds  Medication Sig   FLUoxetine (PROZAC) 40 MG capsule Take 1 capsule (40 mg total) by mouth daily.   lisdexamfetamine (VYVANSE) 30 MG capsule Take 1 capsule (30 mg total) by mouth every morning.   lisinopril (PRINIVIL,ZESTRIL) 20 MG tablet Take 20 mg by mouth daily.   omeprazole (PRILOSEC) 20 MG capsule Take 1 capsule (20 mg total) by mouth every other day.   Vitamin D, Ergocalciferol, (DRISDOL) 1.25 MG (50000 UNIT) CAPS capsule Take 1 capsule (50,000 Units total) by mouth every 7 (seven) days.    Allergies: Patient has No Known Allergies. Family History: Patient family history includes Colon cancer in her paternal grandfather; Diabetes in her paternal grandfather; Heart attack (age of onset: 6) in her maternal grandfather; Heart disease in her father; High Cholesterol in her father and mother; High blood pressure in her father; Hypercholesterolemia in her brother; Hypertension in her maternal grandmother; Other in her mother; Pulmonary fibrosis in her maternal grandmother; Sleep apnea in her brother and father; Stroke in her maternal grandfather; Valvular heart disease in her maternal grandmother. Social History:  Patient  reports that she has never smoked. She has never used smokeless tobacco. She reports current alcohol use. She reports that she does not use drugs.  Review of Systems: Constitutional: Negative  for fever malaise or anorexia Cardiovascular: negative for chest pain Respiratory: negative for SOB or persistent cough Gastrointestinal: negative for abdominal pain  Objective  Vitals: BP 130/84   Pulse 71   Temp 98.2 F (36.8 C)   Ht 5' 8.5" (1.74 m)   Wt 269 lb 3.2 oz (122.1 kg)   SpO2 98%   BMI 40.34 kg/m  General: no acute distress , A&Ox3 HEENT: PEERL, conjunctiva normal, neck is supple, left TM with  supportive effusion and decreased landmarks without erythema, right TM normal, oropharynx mildly erythematous without exudate Cardiovascular:  RRR without murmur or gallop.  Respiratory:  Good breath sounds bilaterally, CTAB with normal respiratory effort Skin:  Warm, no rashes, clammy  Office Visit on 05/17/2022  Component Date Value Ref Range Status   SARS Coronavirus 2 Ag 05/17/2022 Negative  Negative Final   Rapid Strep A Screen 05/17/2022 Negative  Negative Final   Influenza A, POC 05/17/2022 Negative  Negative Final    Commons side effects, risks, benefits, and alternatives for medications and treatment plan prescribed today were discussed, and the patient expressed understanding of the given instructions. Patient is instructed to call or message via MyChart if he/she has any questions or concerns regarding our treatment plan. No barriers to understanding were identified. We discussed Red Flag symptoms and signs in detail. Patient expressed understanding regarding what to do in case of urgent or emergency type symptoms.  Medication list was reconciled, printed and provided to the patient in AVS. Patient instructions and summary information was reviewed with the patient as documented in the AVS. This note was prepared with assistance of Dragon voice recognition software. Occasional wrong-word or sound-a-like substitutions may have occurred due to the inherent limitations of voice recognition software

## 2022-05-20 ENCOUNTER — Encounter: Payer: Self-pay | Admitting: Family Medicine

## 2022-05-25 ENCOUNTER — Ambulatory Visit: Payer: BC Managed Care – PPO | Admitting: Physician Assistant

## 2022-05-26 ENCOUNTER — Ambulatory Visit: Payer: BC Managed Care – PPO | Admitting: Physician Assistant

## 2022-05-31 ENCOUNTER — Other Ambulatory Visit (HOSPITAL_COMMUNITY): Payer: Self-pay

## 2022-05-31 MED ORDER — LISDEXAMFETAMINE DIMESYLATE 30 MG PO CAPS
30.0000 mg | ORAL_CAPSULE | Freq: Every day | ORAL | 0 refills | Status: AC
  Filled 2022-05-31: qty 30, 30d supply, fill #0

## 2022-06-02 ENCOUNTER — Other Ambulatory Visit (HOSPITAL_COMMUNITY): Payer: Self-pay

## 2022-06-29 ENCOUNTER — Other Ambulatory Visit (HOSPITAL_COMMUNITY): Payer: Self-pay

## 2022-10-19 NOTE — Telephone Encounter (Signed)
error 

## 2023-01-04 ENCOUNTER — Other Ambulatory Visit (HOSPITAL_BASED_OUTPATIENT_CLINIC_OR_DEPARTMENT_OTHER): Payer: Self-pay
